# Patient Record
Sex: Female | Born: 1980 | Race: White | Hispanic: No | Marital: Married | Smoking: Former smoker
Health system: Southern US, Community
[De-identification: ages and names within clinical notes are randomized; demographics above are authoritative.]

## PROBLEM LIST (undated history)

## (undated) DIAGNOSIS — S83289A Other tear of lateral meniscus, current injury, unspecified knee, initial encounter: Secondary | ICD-10-CM

## (undated) DIAGNOSIS — M545 Low back pain, unspecified: Secondary | ICD-10-CM

## (undated) DIAGNOSIS — Z87442 Personal history of urinary calculi: Secondary | ICD-10-CM

## (undated) DIAGNOSIS — G8929 Other chronic pain: Secondary | ICD-10-CM

## (undated) DIAGNOSIS — G43909 Migraine, unspecified, not intractable, without status migrainosus: Secondary | ICD-10-CM

## (undated) DIAGNOSIS — S83271A Complex tear of lateral meniscus, current injury, right knee, initial encounter: Secondary | ICD-10-CM

## (undated) HISTORY — PX: KNEE ARTHROSCOPY: SHX127

## (undated) HISTORY — PX: KNEE ARTHROSCOPY: SUR90

## (undated) HISTORY — PX: TYMPANOSTOMY TUBE PLACEMENT: SHX32

---
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
## 2003-04-29 ENCOUNTER — Other Ambulatory Visit: Admission: RE | Admit: 2003-04-29 | Discharge: 2003-04-29 | Payer: Self-pay | Admitting: Gynecology

## 2004-11-07 ENCOUNTER — Encounter: Admission: RE | Admit: 2004-11-07 | Discharge: 2004-11-07 | Payer: Self-pay | Admitting: Orthopedic Surgery

## 2005-01-24 HISTORY — PX: LAPAROSCOPIC GASTRIC BANDING: SHX1100

## 2008-01-25 HISTORY — PX: EXTRACORPOREAL SHOCK WAVE LITHOTRIPSY: SHX1557

## 2008-03-05 ENCOUNTER — Encounter: Admission: RE | Admit: 2008-03-05 | Discharge: 2008-03-05 | Payer: Self-pay | Admitting: Family Medicine

## 2008-04-11 ENCOUNTER — Encounter: Admission: RE | Admit: 2008-04-11 | Discharge: 2008-04-11 | Payer: Self-pay | Admitting: Family Medicine

## 2008-05-22 ENCOUNTER — Encounter: Admission: RE | Admit: 2008-05-22 | Discharge: 2008-05-22 | Payer: Self-pay | Admitting: Family Medicine

## 2008-05-26 ENCOUNTER — Ambulatory Visit (HOSPITAL_COMMUNITY): Admission: RE | Admit: 2008-05-26 | Discharge: 2008-05-26 | Payer: Self-pay | Admitting: Urology

## 2008-11-07 ENCOUNTER — Ambulatory Visit: Payer: Self-pay | Admitting: Obstetrics and Gynecology

## 2008-11-07 ENCOUNTER — Inpatient Hospital Stay (HOSPITAL_COMMUNITY): Admission: AD | Admit: 2008-11-07 | Discharge: 2008-11-07 | Payer: Self-pay | Admitting: Obstetrics and Gynecology

## 2008-11-07 ENCOUNTER — Ambulatory Visit (HOSPITAL_COMMUNITY): Admission: RE | Admit: 2008-11-07 | Discharge: 2008-11-07 | Payer: Self-pay | Admitting: Obstetrics and Gynecology

## 2008-11-07 HISTORY — PX: LYSIS OF ADHESION: SHX5961

## 2008-11-07 HISTORY — PX: EXPLORATORY LAPAROTOMY: SUR591

## 2009-02-11 ENCOUNTER — Ambulatory Visit (HOSPITAL_COMMUNITY): Admission: RE | Admit: 2009-02-11 | Discharge: 2009-02-11 | Payer: Self-pay | Admitting: Obstetrics and Gynecology

## 2009-03-02 ENCOUNTER — Ambulatory Visit (HOSPITAL_COMMUNITY): Admission: RE | Admit: 2009-03-02 | Discharge: 2009-03-04 | Payer: Self-pay | Admitting: General Surgery

## 2009-03-02 HISTORY — PX: VENTRAL HERNIA REPAIR: SHX424

## 2010-04-14 LAB — PREGNANCY, URINE: Preg Test, Ur: NEGATIVE

## 2010-04-29 LAB — CBC
HCT: 37 % (ref 36.0–46.0)
Hemoglobin: 12.3 g/dL (ref 12.0–15.0)
MCHC: 33.1 g/dL (ref 30.0–36.0)
MCV: 85.7 fL (ref 78.0–100.0)
Platelets: 480 10*3/uL — ABNORMAL HIGH (ref 150–400)
RBC: 4.32 MIL/uL (ref 3.87–5.11)
RDW: 15 % (ref 11.5–15.5)
WBC: 10.4 10*3/uL (ref 4.0–10.5)

## 2010-04-29 LAB — PREGNANCY, URINE: Preg Test, Ur: NEGATIVE

## 2010-05-04 LAB — PREGNANCY, URINE: Preg Test, Ur: NEGATIVE

## 2013-06-21 ENCOUNTER — Other Ambulatory Visit: Payer: Self-pay | Admitting: Obstetrics and Gynecology

## 2013-06-21 DIAGNOSIS — N9489 Other specified conditions associated with female genital organs and menstrual cycle: Secondary | ICD-10-CM

## 2013-07-01 ENCOUNTER — Ambulatory Visit
Admission: RE | Admit: 2013-07-01 | Discharge: 2013-07-01 | Disposition: A | Discharge status: Home / Self Care | Payer: 59 | Source: Ambulatory Visit | Attending: Obstetrics and Gynecology | Admitting: Obstetrics and Gynecology

## 2013-07-01 DIAGNOSIS — N9489 Other specified conditions associated with female genital organs and menstrual cycle: Secondary | ICD-10-CM

## 2013-07-01 MED ORDER — GADOBENATE DIMEGLUMINE 529 MG/ML IV SOLN
20.0000 mL | Freq: Once | INTRAVENOUS | Status: AC | PRN
  Administered 2013-07-01: 20 mL via INTRAVENOUS

## 2013-07-17 ENCOUNTER — Encounter (HOSPITAL_BASED_OUTPATIENT_CLINIC_OR_DEPARTMENT_OTHER): Payer: Self-pay | Admitting: *Deleted

## 2013-07-17 NOTE — H&P (Signed)
Valerie Oconnor is an 33 y.o. G 0 with a 4 cm right adnexal mass. MRI confirms solid mass - possible fibroid - possible neoplasm. Right hydrosalpinx is noted. Patient complains of RLQ pain and dyspareunia.  Pertinent Gynecological History: Menses: flow is light Bleeding: none Contraception: none DES exposure: denies Blood transfusions: none Sexually transmitted diseases: no past history Previous GYN Procedures: none  Last mammogram: normal Date: 2014 Last pap: normal Date: 2015 OB History: G0, P0  Menstrual History: Menarche age: unknown  No LMP recorded.    No past medical history on file.  No past surgical history on file.  No family history on file.  Social History:  has no tobacco, alcohol, and drug history on file.  Allergies: Allergies not on file  No prescriptions prior to admission    Review of Systems  All other systems reviewed and are negative.   There were no vitals taken for this visit. Physical Exam  Nursing note and vitals reviewed. Constitutional: She appears well-developed.  HENT:  Head: Normocephalic.  Eyes: Pupils are equal, round, and reactive to light.  Respiratory: Effort normal.  Genitourinary:  Palpable solid  Mass right adnexal area  And tender to patient    No results found for this or any previous visit (from the past 24 hour(s)).  No results found.  Assessment/Plan: RIght adnexal mass Laparotomy, removal of right adnexal mass, possible frozen section with washings and possible right salpingoophorectomy Risks reviewed Patient marked Consent signed GREWAL,MICHELLE L 07/17/2013, 10:17 AM

## 2013-07-17 NOTE — Progress Notes (Signed)
NPO AFTER MN. ARRIVE AT 0600.  PT GETTING LAB WORK DONE THIS WEEK. REVIEWED [facility] GUIDELINES FOR POSS. OWER, WILL BRING MEDS.

## 2013-07-19 LAB — BASIC METABOLIC PANEL
BUN: 8 mg/dL (ref 6–23)
CALCIUM: 9 mg/dL (ref 8.4–10.5)
CO2: 27 mEq/L (ref 19–32)
Chloride: 99 mEq/L (ref 96–112)
Creatinine, Ser: 0.69 mg/dL (ref 0.50–1.10)
GFR calc Af Amer: >90 mL/min (ref >90)
GFR calc non Af Amer: >90 mL/min (ref >90)
GLUCOSE: 125 mg/dL — AB (ref 70–99)
POTASSIUM: 4 meq/L (ref 3.7–5.3)
SODIUM: 139 meq/L (ref 137–147)

## 2013-07-19 LAB — CBC
HCT: 35.7 % — ABNORMAL LOW (ref 36.0–46.0)
Hemoglobin: 11.8 g/dL — ABNORMAL LOW (ref 12.0–15.0)
MCH: 27.9 pg (ref 26.0–34.0)
MCHC: 33.1 g/dL (ref 30.0–36.0)
MCV: 84.4 fL (ref 78.0–100.0)
PLATELETS: 368 10*3/uL (ref 150–400)
RBC: 4.23 MIL/uL (ref 3.87–5.11)
RDW: 13.2 % (ref 11.5–15.5)
WBC: 10.5 10*3/uL (ref 4.0–10.5)

## 2013-07-22 ENCOUNTER — Observation Stay (HOSPITAL_BASED_OUTPATIENT_CLINIC_OR_DEPARTMENT_OTHER)
Admission: RE | Admit: 2013-07-22 | Discharge: 2013-07-23 | Disposition: A | Discharge status: Home / Self Care | Payer: 59 | Source: Ambulatory Visit | Attending: Physician Assistant | Admitting: Physician Assistant

## 2013-07-22 ENCOUNTER — Encounter (HOSPITAL_BASED_OUTPATIENT_CLINIC_OR_DEPARTMENT_OTHER): Payer: 59 | Admitting: Anesthesiology

## 2013-07-22 ENCOUNTER — Encounter (HOSPITAL_BASED_OUTPATIENT_CLINIC_OR_DEPARTMENT_OTHER): Payer: Self-pay | Admitting: *Deleted

## 2013-07-22 ENCOUNTER — Encounter (HOSPITAL_COMMUNITY)
Admission: RE | Disposition: A | Discharge status: Home / Self Care | Payer: Self-pay | Source: Ambulatory Visit | Attending: Obstetrics and Gynecology

## 2013-07-22 ENCOUNTER — Ambulatory Visit (HOSPITAL_BASED_OUTPATIENT_CLINIC_OR_DEPARTMENT_OTHER): Payer: 59 | Admitting: Anesthesiology

## 2013-07-22 DIAGNOSIS — F17200 Nicotine dependence, unspecified, uncomplicated: Secondary | ICD-10-CM | POA: Insufficient documentation

## 2013-07-22 DIAGNOSIS — D219 Benign neoplasm of connective and other soft tissue, unspecified: Secondary | ICD-10-CM | POA: Diagnosis present

## 2013-07-22 DIAGNOSIS — Z9884 Bariatric surgery status: Secondary | ICD-10-CM | POA: Insufficient documentation

## 2013-07-22 DIAGNOSIS — E669 Obesity, unspecified: Secondary | ICD-10-CM | POA: Insufficient documentation

## 2013-07-22 DIAGNOSIS — D259 Leiomyoma of uterus, unspecified: Secondary | ICD-10-CM

## 2013-07-22 DIAGNOSIS — D279 Benign neoplasm of unspecified ovary: Principal | ICD-10-CM | POA: Insufficient documentation

## 2013-07-22 HISTORY — DX: Personal history of urinary calculi: Z87.442

## 2013-07-22 HISTORY — PX: LAPAROTOMY: SHX154

## 2013-07-22 LAB — POCT I-STAT, CHEM 8
BUN: 10 mg/dL (ref 6–23)
Calcium, Ion: 1.17 mmol/L (ref 1.12–1.23)
Chloride: 104 mEq/L (ref 96–112)
Creatinine, Ser: 0.6 mg/dL (ref 0.50–1.10)
Glucose, Bld: 111 mg/dL — ABNORMAL HIGH (ref 70–99)
HEMATOCRIT: 37 % (ref 36.0–46.0)
Hemoglobin: 12.6 g/dL (ref 12.0–15.0)
POTASSIUM: 3.9 meq/L (ref 3.7–5.3)
Sodium: 139 mEq/L (ref 137–147)
TCO2: 22 mmol/L (ref 0–100)

## 2013-07-22 LAB — POCT PREGNANCY, URINE: Preg Test, Ur: NEGATIVE

## 2013-07-22 SURGERY — LAPAROTOMY
Anesthesia: General | Laterality: Right

## 2013-07-22 MED ORDER — DEXTROSE IN LACTATED RINGERS 5 % IV SOLN
INTRAVENOUS | Status: DC
  Administered 2013-07-22 – 2013-07-23 (×2): via INTRAVENOUS
  Filled 2013-07-22: qty 1000

## 2013-07-22 MED ORDER — DEXTROSE 5 % IV SOLN
INTRAVENOUS | Status: AC
  Administered 2013-07-22: 420 mL via INTRAVENOUS

## 2013-07-22 MED ORDER — SODIUM CHLORIDE 0.9 % IR SOLN
Status: DC | PRN
  Administered 2013-07-22: 1000 mL

## 2013-07-22 MED ORDER — MENTHOL 3 MG MT LOZG
1.0000 | LOZENGE | OROMUCOSAL | Status: DC | PRN
  Filled 2013-07-22: qty 9

## 2013-07-22 MED ORDER — IBUPROFEN 600 MG PO TABS
600.0000 mg | ORAL_TABLET | Freq: Four times a day (QID) | ORAL | Status: DC | PRN
  Administered 2013-07-22 – 2013-07-23 (×2): 600 mg via ORAL
  Filled 2013-07-22 (×3): qty 1

## 2013-07-22 MED ORDER — FENTANYL CITRATE 0.05 MG/ML IJ SOLN
INTRAMUSCULAR | Status: AC
  Filled 2013-07-22: qty 6

## 2013-07-22 MED ORDER — STERILE WATER FOR IRRIGATION IR SOLN
Status: DC | PRN
  Administered 2013-07-22: 500 mL

## 2013-07-22 MED ORDER — DEXAMETHASONE SODIUM PHOSPHATE 4 MG/ML IJ SOLN
INTRAMUSCULAR | Status: DC | PRN
  Administered 2013-07-22: 10 mg via INTRAVENOUS

## 2013-07-22 MED ORDER — PROMETHAZINE HCL 25 MG/ML IJ SOLN
INTRAMUSCULAR | Status: AC
  Filled 2013-07-22: qty 1

## 2013-07-22 MED ORDER — ONDANSETRON HCL 4 MG/2ML IJ SOLN
INTRAMUSCULAR | Status: DC | PRN
  Administered 2013-07-22: 4 mg via INTRAVENOUS

## 2013-07-22 MED ORDER — MIDAZOLAM HCL 2 MG/2ML IJ SOLN
INTRAMUSCULAR | Status: AC
  Filled 2013-07-22: qty 2

## 2013-07-22 MED ORDER — KETOROLAC TROMETHAMINE 30 MG/ML IJ SOLN
INTRAMUSCULAR | Status: DC | PRN
  Administered 2013-07-22: 30 mg via INTRAVENOUS

## 2013-07-22 MED ORDER — MIDAZOLAM HCL 5 MG/5ML IJ SOLN
INTRAMUSCULAR | Status: DC | PRN
  Administered 2013-07-22: 2 mg via INTRAVENOUS

## 2013-07-22 MED ORDER — ROCURONIUM BROMIDE 100 MG/10ML IV SOLN
INTRAVENOUS | Status: DC | PRN
  Administered 2013-07-22: 40 mg via INTRAVENOUS

## 2013-07-22 MED ORDER — LACTATED RINGERS IV SOLN
INTRAVENOUS | Status: DC
  Administered 2013-07-22: 07:00:00 via INTRAVENOUS
  Filled 2013-07-22: qty 1000

## 2013-07-22 MED ORDER — FENTANYL CITRATE 0.05 MG/ML IJ SOLN
INTRAMUSCULAR | Status: DC | PRN
Start: 2013-07-22 — End: 2013-07-22
  Administered 2013-07-22: 50 ug via INTRAVENOUS
  Administered 2013-07-22 (×2): 100 ug via INTRAVENOUS
  Administered 2013-07-22: 50 ug via INTRAVENOUS

## 2013-07-22 MED ORDER — PROMETHAZINE HCL 25 MG/ML IJ SOLN
6.2500 mg | INTRAMUSCULAR | Status: DC | PRN
  Administered 2013-07-22: 6.25 mg via INTRAVENOUS
  Filled 2013-07-22: qty 1

## 2013-07-22 MED ORDER — HYDROMORPHONE HCL PF 1 MG/ML IJ SOLN
INTRAMUSCULAR | Status: AC
  Filled 2013-07-22: qty 1

## 2013-07-22 MED ORDER — TRAMADOL HCL 50 MG PO TABS
50.0000 mg | ORAL_TABLET | Freq: Four times a day (QID) | ORAL | Status: DC | PRN
  Administered 2013-07-22 – 2013-07-23 (×3): 50 mg via ORAL
  Filled 2013-07-22 (×4): qty 1

## 2013-07-22 MED ORDER — GENTAMICIN SULFATE 40 MG/ML IJ SOLN
420.0000 mg | INTRAVENOUS | Status: DC
  Filled 2013-07-22: qty 10.5

## 2013-07-22 MED ORDER — KETOROLAC TROMETHAMINE 30 MG/ML IJ SOLN
30.0000 mg | Freq: Once | INTRAMUSCULAR | Status: DC
  Administered 2013-07-22: 30 mg via INTRAVENOUS
  Filled 2013-07-22: qty 1

## 2013-07-22 MED ORDER — GLYCOPYRROLATE 0.2 MG/ML IJ SOLN
INTRAMUSCULAR | Status: DC | PRN
  Administered 2013-07-22: 0.4 mg via INTRAVENOUS

## 2013-07-22 MED ORDER — LACTATED RINGERS IV SOLN
INTRAVENOUS | Status: DC
  Filled 2013-07-22: qty 1000

## 2013-07-22 MED ORDER — NEOSTIGMINE METHYLSULFATE 10 MG/10ML IV SOLN
INTRAVENOUS | Status: DC | PRN
  Administered 2013-07-22: 3 mg via INTRAVENOUS

## 2013-07-22 MED ORDER — LACTATED RINGERS IV SOLN
INTRAVENOUS | Status: DC
  Administered 2013-07-22: 06:00:00 via INTRAVENOUS
  Filled 2013-07-22: qty 1000

## 2013-07-22 MED ORDER — ZOLPIDEM TARTRATE 5 MG PO TABS
5.0000 mg | ORAL_TABLET | Freq: Every evening | ORAL | Status: DC | PRN
  Administered 2013-07-23: 5 mg via ORAL
  Filled 2013-07-22: qty 1

## 2013-07-22 MED ORDER — HYDROMORPHONE HCL PF 1 MG/ML IJ SOLN
0.2500 mg | INTRAMUSCULAR | Status: DC | PRN
  Administered 2013-07-22 (×3): 0.25 mg via INTRAVENOUS
  Filled 2013-07-22: qty 1

## 2013-07-22 MED ORDER — HYDROMORPHONE HCL 2 MG PO TABS
2.0000 mg | ORAL_TABLET | ORAL | Status: DC | PRN
  Administered 2013-07-22 – 2013-07-23 (×5): 2 mg via ORAL
  Filled 2013-07-22 (×6): qty 1

## 2013-07-22 MED ORDER — LIDOCAINE HCL (CARDIAC) 20 MG/ML IV SOLN
INTRAVENOUS | Status: DC | PRN
  Administered 2013-07-22: 100 mg via INTRAVENOUS

## 2013-07-22 MED ORDER — DULOXETINE HCL 60 MG PO CPEP
60.0000 mg | ORAL_CAPSULE | Freq: Every evening | ORAL | Status: DC
  Administered 2013-07-22: 60 mg via ORAL
  Filled 2013-07-22 (×3): qty 1

## 2013-07-22 MED ORDER — PROPOFOL 10 MG/ML IV BOLUS
INTRAVENOUS | Status: DC | PRN
  Administered 2013-07-22: 100 mg via INTRAVENOUS
  Administered 2013-07-22: 200 mg via INTRAVENOUS

## 2013-07-22 SURGICAL SUPPLY — 47 items
BAG URINE DRAINAGE (UROLOGICAL SUPPLIES) ×2 IMPLANT
BLADE HEX COATED 2.75 (ELECTRODE) ×2 IMPLANT
BLADE SURG 10 STRL SS (BLADE) ×2 IMPLANT
CANISTER SUCT 3000ML (MISCELLANEOUS) ×2 IMPLANT
CATH FOLEY 2WAY SLVR  5CC 14FR (CATHETERS) ×1
CATH FOLEY 2WAY SLVR 5CC 14FR (CATHETERS) ×1 IMPLANT
CLEANER CAUTERY TIP 5X5 PAD (MISCELLANEOUS) ×1 IMPLANT
COVER MAYO STAND STRL (DRAPES) ×2 IMPLANT
COVER TABLE BACK 60X90 (DRAPES) ×2 IMPLANT
DERMABOND ADVANCED (GAUZE/BANDAGES/DRESSINGS) ×1
DERMABOND ADVANCED .7 DNX12 (GAUZE/BANDAGES/DRESSINGS) ×1 IMPLANT
DRAPE WARM FLUID 44X44 (DRAPE) ×2 IMPLANT
DURAPREP 26ML APPLICATOR (WOUND CARE) ×2 IMPLANT
ELECT BLADE 6.5 .24CM SHAFT (ELECTRODE) ×2 IMPLANT
ELECT REM PT RETURN 9FT ADLT (ELECTROSURGICAL) ×2
ELECTRODE REM PT RTRN 9FT ADLT (ELECTROSURGICAL) ×1 IMPLANT
GAUZE SPONGE 4X4 16PLY XRAY LF (GAUZE/BANDAGES/DRESSINGS) ×2 IMPLANT
GLOVE BIO SURGEON STRL SZ 6.5 (GLOVE) ×2 IMPLANT
GLOVE BIO SURGEON STRL SZ7.5 (GLOVE) ×2 IMPLANT
GLOVE BIOGEL PI IND STRL 7.0 (GLOVE) ×3 IMPLANT
GLOVE BIOGEL PI INDICATOR 7.0 (GLOVE) ×3
GOWN STRL REUS W/ TWL LRG LVL3 (GOWN DISPOSABLE) ×1 IMPLANT
GOWN STRL REUS W/ TWL XL LVL3 (GOWN DISPOSABLE) ×1 IMPLANT
GOWN STRL REUS W/TWL LRG LVL3 (GOWN DISPOSABLE) ×3 IMPLANT
GOWN STRL REUS W/TWL XL LVL3 (GOWN DISPOSABLE) ×1
HOLDER FOLEY CATH W/STRAP (MISCELLANEOUS) ×2 IMPLANT
NEEDLE HYPO 22GX1.5 SAFETY (NEEDLE) ×2 IMPLANT
NS IRRIG 500ML POUR BTL (IV SOLUTION) ×4 IMPLANT
PACK ABDOMINAL GYN (CUSTOM PROCEDURE TRAY) ×2 IMPLANT
PACK BASIN DAY SURGERY FS (CUSTOM PROCEDURE TRAY) ×2 IMPLANT
PAD CLEANER CAUTERY TIP 5X5 (MISCELLANEOUS) ×1
PAD OB MATERNITY 4.3X12.25 (PERSONAL CARE ITEMS) ×2 IMPLANT
PENCIL BUTTON HOLSTER BLD 10FT (ELECTRODE) ×2 IMPLANT
SPONGE LAP 18X18 X RAY DECT (DISPOSABLE) ×2 IMPLANT
SUT PLAIN 2 0 XLH (SUTURE) ×2 IMPLANT
SUT VIC AB 0 CT1 18XCR BRD8 (SUTURE) ×1 IMPLANT
SUT VIC AB 0 CT1 36 (SUTURE) ×4 IMPLANT
SUT VIC AB 0 CT1 8-18 (SUTURE) ×1
SUT VIC AB 4-0 KS 27 (SUTURE) ×2 IMPLANT
SUT VICRYL 0 TIES 12 18 (SUTURE) ×2 IMPLANT
SYR BULB IRRIGATION 50ML (SYRINGE) ×2 IMPLANT
SYR CONTROL 10ML LL (SYRINGE) ×2 IMPLANT
TOWEL OR 17X24 6PK STRL BLUE (TOWEL DISPOSABLE) ×4 IMPLANT
TRAY DSU PREP LF (CUSTOM PROCEDURE TRAY) ×2 IMPLANT
TUBE CONNECTING 12X1/4 (SUCTIONS) ×2 IMPLANT
WATER STERILE IRR 500ML POUR (IV SOLUTION) ×2 IMPLANT
YANKAUER SUCT BULB TIP NO VENT (SUCTIONS) ×2 IMPLANT

## 2013-07-22 NOTE — Transfer of Care (Signed)
Immediate Anesthesia Transfer of Care Note  Patient: Valerie Oconnor  Procedure(s) Performed: Procedure(s): LAPAROTOMY, REMOVAL OF RIGHT OVARIAN MASS FROZEN SECTION, peritoneal washings (Right)  Patient Location: PACU  Anesthesia Type:General  Level of Consciousness: awake, alert , oriented and patient cooperative  Airway & Oxygen Therapy: Patient Spontanous Breathing and Patient connected to nasal cannula oxygen  Post-op Assessment: Report given to PACU RN and Post -op Vital signs reviewed and stable  Post vital signs: Reviewed and stable  Complications: No apparent anesthesia complications

## 2013-07-22 NOTE — Anesthesia Procedure Notes (Signed)
Procedure Name: Intubation Date/Time: 07/22/2013 7:32 AM Performed by: Wanita Chamberlain Pre-anesthesia Checklist: Patient identified, Timeout performed, Emergency Drugs available, Suction available and Patient being monitored Oxygen Delivery Method: Circle system utilized Preoxygenation: Pre-oxygenation with 100% oxygen Intubation Type: IV induction Ventilation: Mask ventilation without difficulty Laryngoscope Size: Mac and 3 Grade View: Grade I Tube type: Oral Tube size: 7.0 mm Number of attempts: 1 Airway Equipment and Method: Stylet Placement Confirmation: ETT inserted through vocal cords under direct vision,  breath sounds checked- equal and bilateral and positive ETCO2 Secured at: 21 cm Tube secured with: Tape Dental Injury: Teeth and Oropharynx as per pre-operative assessment

## 2013-07-22 NOTE — Op Note (Signed)
NAMEGEORGINA, Valerie Oconnor             ACCOUNT NO.:  [id number]  MEDICAL RECORD NO.:  [id number]  LOCATION:  [place]                         FACILITY:  [facility]  PHYSICIAN:  Covington Grewal, M.D.DATE OF BIRTH:  Nov 20, 1980  DATE OF PROCEDURE:  07/22/2013 DATE OF DISCHARGE:                              OPERATIVE REPORT   PREOPERATIVE DIAGNOSIS:  Solid right adnexal mass.  POSTOPERATIVE DIAGNOSIS:  Right ovarian fibroid.  PROCEDURE:  Mini-laparotomy, pelvic washings, frozen section with removal of solid right adnexal mass.  SURGEON:  Michelle L. Helane Rima, MD  ASSISTANT:  Evette Cristal, MD  ANESTHESIA:  General.  ESTIMATED BLOOD LOSS:  Minimal.  COMPLICATIONS:  None.  DRAINS:  Foley.  PATHOLOGY:  Right ovarian fibroid, sent to pathology.  DESCRIPTION OF PROCEDURE:  The patient was taken to the operating room. After she had been consented about the risk associated with the procedure, she was intubated.  She was then prepped and draped.  A Foley catheter was inserted.  Time-out was performed according to guidelines. A small mini-laparotomy Pfannenstiel incision was made and carried down to the fascia.  Fascia scored in the midline and extended laterally. Rectus muscles were separated in the midline.  The peritoneum was entered bluntly.  Peritoneal incision was then stretched.  Pelvic washings were performed.  Exam revealed a normal left adnexa.  She did have an enlarged right ovarian solid mass.  There were some little blebs on the surface.  For that reason, we decided to remove the mass and sent it for frozen section.  I placed a Haney clamp just beneath the base.  I was able to separate the mass easily from the ovary and the pedicle was secured using a suture ligature and a free tie using 0 Vicryl suture. Hemostasis was very good.  The fallopian tube looked fine.  We waited for frozen section and frozen section came back benign ovarian neoplasm favoring right ovarian fibroid versus  __________.  At this point, we decided to proceed with closure.  We closed the peritoneum using 0 Vicryl running stitch.  The rectus muscles were reapproximated using 0 Vicryl.  The fascia was closed using 0 Vicryl starting at each corner and meeting in the midline.  After irrigation of subcutaneous layer, the subcu layer was closed with plain gut interrupted.  The skin was closed with 4-0 Vicryl on a Keith needle.  All sponge, lap, and instrument counts were correct x2.  The patient went to recovery room in stable condition.     Michelle L. Helane Rima, M.D.     [name]  D:  07/22/2013  T:  07/22/2013  Job:  [id number]

## 2013-07-22 NOTE — Anesthesia Postprocedure Evaluation (Signed)
Anesthesia Post Note  Patient: Valerie Oconnor  Procedure(s) Performed: Procedure(s) (LRB): LAPAROTOMY, REMOVAL OF RIGHT OVARIAN MASS FROZEN SECTION, peritoneal washings (Right)  Anesthesia type: General  Patient location: PACU  Post pain: Pain level controlled  Post assessment: Post-op Vital signs reviewed  Last Vitals:  Filed Vitals:   07/22/13 1145  BP: 120/80  Pulse: 78  Temp: 36.3 C  Resp: 18    Post vital signs: Reviewed  Level of consciousness: sedated  Complications: No apparent anesthesia complications

## 2013-07-22 NOTE — Anesthesia Preprocedure Evaluation (Addendum)
Anesthesia Evaluation  Patient identified by MRN, date of birth, ID band Patient awake    Reviewed: Allergy & Precautions, H&P , NPO status , Patient's Chart, lab work & pertinent test results  Airway Mallampati: II TM Distance: >3 FB Neck ROM: Full    Dental  (+) Teeth Intact, Dental Advisory Given   Pulmonary neg pulmonary ROS, Current Smoker,  breath sounds clear to auscultation        Cardiovascular Exercise Tolerance: Good negative cardio ROS  Rhythm:Regular Rate:Normal     Neuro/Psych negative neurological ROS  negative psych ROS   GI/Hepatic negative GI ROS, Neg liver ROS, Prior gastric banding   Endo/Other  negative endocrine ROSMorbid obesity  Renal/GU negative Renal ROS  negative genitourinary   Musculoskeletal negative musculoskeletal ROS (+)   Abdominal (+) + obese,   Peds  Hematology negative hematology ROS (+)   Anesthesia Other Findings Lap. Band in the past  Reproductive/Obstetrics negative OB ROS                       Anesthesia Physical Anesthesia Plan  ASA: II  Anesthesia Plan: General   Post-op Pain Management:    Induction: Intravenous  Airway Management Planned: Oral ETT  Additional Equipment:   Intra-op Plan:   Post-operative Plan: Extubation in OR  Informed Consent: I have reviewed the patients History and Physical, chart, labs and discussed the procedure including the risks, benefits and alternatives for the proposed anesthesia with the patient or authorized representative who has indicated his/her understanding and acceptance.   Dental advisory given  Plan Discussed with: CRNA  Anesthesia Plan Comments:         Anesthesia Quick Evaluation

## 2013-07-22 NOTE — Brief Op Note (Signed)
07/22/2013  8:54 AM  PATIENT:  Valerie Oconnor  33 y.o. female  PRE-OPERATIVE DIAGNOSIS:  right ovarian mass  POST-OPERATIVE DIAGNOSIS:  right ovarian mass  PROCEDURE:  Procedure(s): LAPAROTOMY, REMOVAL OF RIGHT OVARIAN MASS FROZEN SECTION, peritoneal washings (Right)  SURGEON:  Surgeon(s) and Role:    * Cyril Mourning, MD - Primary    * W Evette Cristal, MD  PHYSICIAN ASSISTANT:   ASSISTANTS: none   ANESTHESIA:   general  EBL:  Total I/O In: 1050 [I.V.:1050] Out: 140 [Urine:125; Blood:15]  BLOOD ADMINISTERED:none  DRAINS: Urinary Catheter (Foley)   LOCAL MEDICATIONS USED:  NONE  SPECIMEN:  Source of Specimen:  right ovary  DISPOSITION OF SPECIMEN:  PATHOLOGY  COUNTS:  YES  TOURNIQUET:  * No tourniquets in log *  DICTATION: .Other Dictation: Dictation Number [id number]  PLAN OF CARE: Admit for overnight observation  PATIENT DISPOSITION:  PACU - hemodynamically stable.   Delay start of Pharmacological VTE agent (>24hrs) due to surgical blood loss or risk of bleeding: not applicable

## 2013-07-23 ENCOUNTER — Encounter (HOSPITAL_BASED_OUTPATIENT_CLINIC_OR_DEPARTMENT_OTHER): Payer: Self-pay | Admitting: Obstetrics and Gynecology

## 2013-07-23 LAB — CBC
HEMATOCRIT: 31.7 % — AB (ref 36.0–46.0)
Hemoglobin: 10.4 g/dL — ABNORMAL LOW (ref 12.0–15.0)
MCH: 27.7 pg (ref 26.0–34.0)
MCHC: 32.8 g/dL (ref 30.0–36.0)
MCV: 84.5 fL (ref 78.0–100.0)
PLATELETS: 351 10*3/uL (ref 150–400)
RBC: 3.75 MIL/uL — ABNORMAL LOW (ref 3.87–5.11)
RDW: 13.6 % (ref 11.5–15.5)
WBC: 11.7 10*3/uL — AB (ref 4.0–10.5)

## 2013-07-23 MED ORDER — TRAMADOL HCL 50 MG PO TABS: 50.0000 mg | ORAL_TABLET | Freq: Four times a day (QID) | ORAL | Status: DC | PRN

## 2013-07-23 MED ORDER — IBUPROFEN 600 MG PO TABS: 600.0000 mg | ORAL_TABLET | Freq: Four times a day (QID) | ORAL | Status: DC | PRN

## 2013-07-23 MED ORDER — HYDROMORPHONE HCL 2 MG PO TABS
4.0000 mg | ORAL_TABLET | ORAL | Status: DC | PRN
  Administered 2013-07-23: 2 mg via ORAL
  Filled 2013-07-23 (×2): qty 2

## 2013-07-23 MED ORDER — BIOTENE DRY MOUTH MT LIQD
15.0000 mL | Freq: Two times a day (BID) | OROMUCOSAL | Status: DC
  Administered 2013-07-23: 15 mL via OROMUCOSAL

## 2013-07-23 MED ORDER — HYDROMORPHONE HCL 2 MG PO TABS
4.0000 mg | ORAL_TABLET | ORAL | Status: DC | PRN
  Administered 2013-07-23: 4 mg via ORAL

## 2013-07-23 MED ORDER — HYDROMORPHONE HCL 4 MG PO TABS: 4.0000 mg | ORAL_TABLET | ORAL | Status: DC | PRN

## 2013-07-23 NOTE — Discharge Summary (Signed)
Admission diagnosis: Solid adnexal mass  Discharge diagnosis: Right ovarian fibroid  Hospital Course: 33 year old female with right adnexal mass. Underwent laparotomy and removal of right ovarian fibroid. She did very well post op with good pain control on dilaudid 4 mg every 3 hours. She went home on POD #1 after ambulating, voiding and tolerating regular diet. She will follow up in 1 week She was given the standard discharge instructions.

## 2013-07-23 NOTE — Progress Notes (Signed)
1 Day Post-Op Procedure(s) (LRB): LAPAROTOMY, REMOVAL OF RIGHT OVARIAN MASS FROZEN SECTION, peritoneal washings (Right)  Subjective: Patient reports incisional pain, tolerating PO and no problems voiding.  Pain at a 6 on 2 mg Dilaudid.  Objective: I have reviewed patient's vital signs, intake and output, medications and labs.  General: alert, cooperative and appears stated age Vaginal Bleeding: none abdomen is soft and non tender  Assessment: s/p Procedure(s): LAPAROTOMY, REMOVAL OF RIGHT OVARIAN MASS FROZEN SECTION, peritoneal washings (Right): stable, progressing well and tolerating diet  Plan: Advance diet Encourage ambulation Discontinue IV fluids  LOS: 1 day    Donie Lemelin L 07/23/2013, 7:17 AM

## 2014-02-25 ENCOUNTER — Other Ambulatory Visit: Payer: Self-pay | Admitting: Obstetrics and Gynecology

## 2014-02-26 LAB — CYTOLOGY - PAP

## 2015-09-03 ENCOUNTER — Encounter: Payer: Self-pay | Admitting: Physical Therapy

## 2015-09-03 ENCOUNTER — Ambulatory Visit: Payer: 59 | Attending: Anesthesiology | Admitting: Physical Therapy

## 2015-09-03 DIAGNOSIS — M5440 Lumbago with sciatica, unspecified side: Secondary | ICD-10-CM | POA: Diagnosis present

## 2015-09-03 DIAGNOSIS — M6281 Muscle weakness (generalized): Secondary | ICD-10-CM | POA: Diagnosis not present

## 2015-09-03 NOTE — Therapy (Signed)
[facility] [address], [place], [place], [address] Phone: [phone]   Fax:  [phone]  Physical Therapy Evaluation  Patient Details  Name: Valerie Oconnor MRN: [id number] Date of Birth: 20-Apr-1980 Referring Provider: Dr. Shanon Ace  Encounter Date: 09/03/2015      PT End of Session - 09/03/15 1311    Visit Number 1   Date for PT Re-Evaluation 10/15/15   PT Start Time 1230   PT Stop Time 1310   PT Time Calculation (min) 40 min   Activity Tolerance Patient tolerated treatment well   Behavior During Therapy [facility] for tasks assessed/performed      Past Medical History:  Diagnosis Date  . Adnexal mass    RIGHT OVARY  . History of kidney stones   . Wears contact lenses     Past Surgical History:  Procedure Laterality Date  . ADENOIDECTOMY  1992  . EXPLORATORY LAPAROTOMY WITH LYSIS ADHESIONS AND REMOVAL OVARIAN MASS  2011  . EXTRACORPOREAL SHOCK WAVE LITHOTRIPSY  2010  . INGUINAL HERNIA REPAIR Bilateral 1985  . KNEE ARTHROSCOPY Bilateral LEFT X1  2001///   RIGHT  2002  &  2004  . LAPAROSCOPIC GASTRIC BANDING  2007  . LAPAROTOMY Right 07/22/2013   Procedure: LAPAROTOMY, REMOVAL OF RIGHT OVARIAN MASS FROZEN SECTION, peritoneal washings;  Surgeon: Cyril Mourning, MD;  Location: [place];  Service: Gynecology;  Laterality: Right;  . TONSILLECTOMY  1993  . TYMPANOSTOMY TUBE PLACEMENT Bilateral X6  sets  LAST ONE  1992  . VENTRAL HERNIA REPAIR  2012    There were no vitals filed for this visit.       Subjective Assessment - 09/03/15 1243    Subjective Patient reports constant back pain.  Chronic  back pain.  Progressively worse. 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    Limitations Sitting;Standing;Walking   How long can you sit comfortably? depends on chair and back rest   Patient Stated Goals get back into school for nursing, ride horses again   Currently in Pain?  Yes   Pain Score 5    Pain Location Back   Pain Orientation Lower   Pain Descriptors / Indicators Spasm;Aching;Constant;Shooting   Pain Type Chronic pain   Pain Radiating Towards to right leg and gets weakness with walking   Pain Onset Other (comment)   Pain Frequency Constant   Aggravating Factors  sleeping, sitting, standing   Pain Relieving Factors pain medicine takes the edge off, heat   Multiple Pain Sites Yes            [facility] PT Assessment - 09/03/15 0001      Assessment   Medical Diagnosis M54.5 lumbago  Dr. Shanon Ace   Referring Provider Dr. Shanon Ace   Onset Date/Surgical Date 03/06/15   Prior Therapy None for back     Precautions   Precautions None     Restrictions   Weight Bearing Restrictions No     Balance Screen   Has the patient fallen in the past 6 months Yes  flipped over baby gait when not looking where she was going   How many times? 1   Has the patient had a decrease in activity level because of a fear of falling?  No   Is the patient reluctant to leave their home because of a fear of falling?  No     Prior Function   Level of Independence Independent  Leisure not active     Cognition   Overall Cognitive Status Within Functional Limits for tasks assessed     Observation/Other Assessments   Focus on Therapeutic Outcomes (FOTO)  52% limitation  goal is 43% limitation     ROM / Strength   AROM / PROM / Strength AROM;Strength     AROM   Overall AROM Comments when patient moves her trunk majority of movement is at L3 level   Lumbar Flexion full   Lumbar Extension decreased by 50%   Lumbar - Right Side Bend decreased by 50%   Lumbar - Left Side Bend decreased by 50%     Strength   Right Hip Flexion 4/5   Right Hip External Rotation  4/5   Right Hip ABduction 4/5   Left Hip External Rotation 4/5     Palpation   Spinal mobility T5-L3 decreased post. ant movement   Palpation comment tenderness located on left  lower thoracic/lumbar paraspinals, left quadratus,                            PT Education - 09/03/15 1311    Education provided Yes   Education Details stretches for back   Person(s) Educated Patient   Methods Explanation;Demonstration;Handout   Comprehension Verbalized understanding;Returned demonstration          PT Short Term Goals - 09/03/15 1319      PT SHORT TERM GOAL #1   Title indepedent with initial HEP   Time 3   Period Weeks   Status New     PT SHORT TERM GOAL #2   Title ability to clean her house for 1 hour with pain decreased >/= 25%    Time 3   Period Weeks   Status New     PT SHORT TERM GOAL #3   Title lightly ride her horse for 20 min with minimal pain   Time 3   Period Weeks   Status New           PT Long Term Goals - 09/03/15 1306      PT LONG TERM GOAL #1   Title independent with HEP   Time 6   Period Weeks   Status New     PT LONG TERM GOAL #2   Title be able to clean her house with correct body mechanics for 1 hour and take 2 rests   Time 6   Period Weeks   Status New     PT LONG TERM GOAL #3   Title lightly ride her horse for 1 hour due to improved back movement   Time 6   Period Weeks   Status New     PT LONG TERM GOAL #4   Title groom horse with correct body mechanics for 1 hour due to increased back strength   Time 6   Period Weeks   Status New     PT LONG TERM GOAL #5   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  Patient reports her constant lumbar pain is 5/10 with sleeping,  standing, lifting, and sitting increases pain. HIp strength averages 4/5. Lumbar ROM limted by 50% except with flexion and majority of movement is at L3.  Decreased mobility  of T5-L3.  Palpable tenderness located in left quadratus and left low thoracic and lumbar paraspinals.  Abdominal strength is 2/5.  Patient has stopped riding her horse due to pain and stopped being a CNA. Patient is of low complexity.  Patient will benefit from skilled PT to increase lumbar ROM and strength so she is able to ride horses and return to being a CNA.     Rehab Potential Good   PT Frequency 2x / week   PT Duration 6 weeks   PT Treatment/Interventions Dry needling;Therapeutic exercise;Therapeutic activities;Cryotherapy;Electrical Stimulation;Ultrasound;Traction;Moist Heat;Patient/family education;Neuromuscular re-education;Manual techniques;Passive range of motion;Taping;Energy conservation  home tens unit   PT Next Visit Plan dry needling to left low thoracic/lumbar paraspinals and left quadratus, Lumbar stabilization exercises, soft tissue work, lumbar traction, body mechanics    PT Home Exercise Plan back stabilization   Recommended Other Services none   Consulted and Agree with Plan of Care Patient      Patient will benefit from skilled therapeutic intervention in order to improve the following deficits and impairments:  Decreased range of motion, Increased fascial restricitons, Increased muscle spasms, Decreased endurance, Decreased activity tolerance, Pain, Impaired flexibility, Decreased strength, Decreased mobility  Visit Diagnosis: Muscle weakness (generalized) - Plan: PT plan of care cert/re-cert  Low back pain with sciatica, sciatica laterality unspecified, unspecified back pain laterality - Plan: PT plan of care cert/re-cert     Problem List Patient Active Problem List   Diagnosis Date Noted  . Fibroid 07/22/2013   Earlie Counts, PT 09/03/15 1:24 PM   [facility] [address], [place], [place], [address] Phone: [phone]   Fax:  [phone]  Name: Valerie Oconnor MRN: [id number] Date of  Birth: 01/19/1981

## 2015-09-03 NOTE — Patient Instructions (Addendum)
Knee-to-Chest Stretch: Unilateral    With hand behind right knee, pull knee in to chest until a comfortable stretch is felt in lower back and buttocks. Keep back relaxed. Hold _30___ seconds. Repeat __2__ times per set. Do _1___ sets per session. Do __1__ sessions per day.  http://orth.exer.us/126   Copyright  VHI. All rights reserved.   Lumbar Rotation Stretch    Lie on back with left knee drawn toward chest. Slowly bring bent leg across body until stretch is felt in lower back/hip area. Hold _30___ seconds. Can use hand to bring bent knee over.  Repeat _2__ times per set. Do __1__ sets per session. Do __1__ sessions per day.  http://orth.exer.us/198   Copyright  VHI. All rights reserved.  Mid-Back Stretch    Push chest toward floor, reaching forward as far as possible. Hold __30__ seconds. Repeat _2___ times per set. Do _1___ sets per session. Do __1__ sessions per day.  http://orth.exer.us/130   Copyright  VHI. All rights reserved.  [place] [address], [place], [place] [address] Phone # [phone] Fax [phone]

## 2015-09-07 ENCOUNTER — Ambulatory Visit: Payer: 59 | Admitting: Physical Therapy

## 2015-09-10 ENCOUNTER — Ambulatory Visit: Payer: 59 | Admitting: Physical Therapy

## 2015-09-10 DIAGNOSIS — M6281 Muscle weakness (generalized): Secondary | ICD-10-CM

## 2015-09-10 DIAGNOSIS — M5440 Lumbago with sciatica, unspecified side: Secondary | ICD-10-CM

## 2015-09-10 NOTE — Patient Instructions (Signed)
     Trigger Point Dry Needling  . What is Trigger Point Dry Needling (DN)? o DN is a physical therapy technique used to treat muscle pain and dysfunction. Specifically, DN helps deactivate muscle trigger points (muscle knots).  o A thin filiform needle is used to penetrate the skin and stimulate the underlying trigger point. The goal is for a local twitch response (LTR) to occur and for the trigger point to relax. No medication of any kind is injected during the procedure.   . What Does Trigger Point Dry Needling Feel Like?  o The procedure feels different for each individual patient. Some patients report that they do not actually feel the needle enter the skin and overall the process is not painful. Very mild bleeding may occur. However, many patients feel a deep cramping in the muscle in which the needle was inserted. This is the local twitch response.   . How Will I feel after the treatment? o Soreness is normal, and the onset of soreness may not occur for a few hours. Typically this soreness does not last longer than two days.  o Bruising is uncommon, however; ice can be used to decrease any possible bruising.  o In rare cases feeling tired or nauseous after the treatment is normal. In addition, your symptoms may get worse before they get better, this period will typically not last longer than 24 hours.   . What Can I do After My Treatment? o Increase your hydration by drinking more water for the next 24 hours. o You may place ice or heat on the areas treated that have become sore, however, do not use heat on inflamed or bruised areas. Heat often brings more relief post needling. o You can continue your regular activities, but vigorous activity is not recommended initially after the treatment for 24 hours. o DN is best combined with other physical therapy such as strengthening, stretching, and other therapies.    Shatiqua Heroux PT [facility] [address], [address] [place], [place] [address] Phone # [phone] Fax [phone] 

## 2015-09-10 NOTE — Therapy (Signed)
[facility] [address] [place], [place], [address] Phone: [phone]   Fax:  [phone]  Physical Therapy Treatment  Patient Details  Name: Valerie Oconnor MRN: [id number] Date of Birth: 1980/07/26 Referring Provider: Dr. Shanon Ace  Encounter Date: 09/10/2015      PT End of Session - 09/10/15 1443    Visit Number 2   Date for PT Re-Evaluation 10/15/15   PT Start Time 0800   PT Stop Time 0900   PT Time Calculation (min) 60 min   Activity Tolerance Patient tolerated treatment well      Past Medical History:  Diagnosis Date  . Adnexal mass    RIGHT OVARY  . History of kidney stones   . Wears contact lenses     Past Surgical History:  Procedure Laterality Date  . ADENOIDECTOMY  1992  . EXPLORATORY LAPAROTOMY WITH LYSIS ADHESIONS AND REMOVAL OVARIAN MASS  2011  . EXTRACORPOREAL SHOCK WAVE LITHOTRIPSY  2010  . INGUINAL HERNIA REPAIR Bilateral 1985  . KNEE ARTHROSCOPY Bilateral LEFT X1  2001///   RIGHT  2002  &  2004  . LAPAROSCOPIC GASTRIC BANDING  2007  . LAPAROTOMY Right 07/22/2013   Procedure: LAPAROTOMY, REMOVAL OF RIGHT OVARIAN MASS FROZEN SECTION, peritoneal washings;  Surgeon: Cyril Mourning, MD;  Location: [place];  Service: Gynecology;  Laterality: Right;  . TONSILLECTOMY  1993  . TYMPANOSTOMY TUBE PLACEMENT Bilateral X6  sets  LAST ONE  1992  . VENTRAL HERNIA REPAIR  2012    There were no vitals filed for this visit.      Subjective Assessment - 09/10/15 0806    Subjective I've been doing my stretching.  The child pose has to be modified b/c of my knees.  They increased my Percoset yesterday which has helped a bit.  I got to sleep a little last night.  Getting an ESI on Monday.     Currently in Pain? Yes   Pain Score 4    Pain Location Back   Pain Orientation Lower   Pain Radiating Towards pain down right leg comes and goes                          [facility] Adult PT Treatment/Exercise - 09/10/15 0001      Lumbar Exercises: Supine   Ab Set 10 reps   Isometric Hip Flexion 10 reps     Moist Heat Therapy   Number Minutes Moist Heat 15 Minutes   Moist Heat Location Lumbar Spine     Electrical Stimulation   Electrical Stimulation Location lumbar   Electrical Stimulation Action IFC   Electrical Stimulation Parameters 7 ma   Electrical Stimulation Goals Pain     Manual Therapy   Soft tissue mobilization lumbar paraspinals and left quadruatus lumborum          Trigger Point Dry Needling - 09/10/15 1442    Consent Given? Yes   Education Handout Provided Yes   Muscles Treated Lower Body --  bilateral lumbar multifidi; left quadruatus lumborum              PT Education - 09/10/15 1443    Education provided Yes   Education Details ab brace grade 1/2;  dry needling after care   Person(s) Educated Patient   Methods Explanation;Demonstration;Handout   Comprehension Verbalized understanding;Returned demonstration          PT Short Term Goals -  09/10/15 1552      PT SHORT TERM GOAL #1   Title indepedent with initial HEP   Time 3   Period Weeks   Status On-going     PT SHORT TERM GOAL #2   Title ability to clean her house for 1 hour with pain decreased >/= 25%    Time 3   Period Weeks   Status On-going     PT SHORT TERM GOAL #3   Title lightly ride her horse for 20 min with minimal pain   Time 3   Period Weeks   Status On-going           PT Long Term Goals - 09/10/15 1553      PT LONG TERM GOAL #1   Title independent with HEP   Time 6   Period Weeks   Status On-going     PT LONG TERM GOAL #2   Title be able to clean her house with correct body mechanics for 1 hour and take 2 rests   Time 6   Period Weeks   Status On-going     PT LONG TERM GOAL #3   Title lightly ride her horse for 1 hour due to improved back movement   Period Weeks   Status On-going      PT LONG TERM GOAL #4   Title groom horse with correct body mechanics for 1 hour due to increased back strength   Time 6   Period Weeks   Status On-going     PT LONG TERM GOAL #5   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    Time 6   Period Weeks   Status On-going               Plan - 09/10/15 1443    Clinical Impression Statement The patient has difficulty activating transverse abdominals requiring verbal and tactile cues.  Marked tender point in left quadruatus lumborum muscle with improved muscle length following dry needling and manual techniques.  Good pain relief from e-stim/heat.  Therapist closely monitoring response with all treatment interventions.     PT Next Visit Plan assess response to DN #1;  add psoas and quadratus stretching;  progress core stabilization ex's;  continue with manual techniques;  modalities      Patient will benefit from skilled therapeutic intervention in order to improve the following deficits and impairments:     Visit Diagnosis: Muscle weakness (generalized)  Low back pain with sciatica, sciatica laterality unspecified, unspecified back pain laterality     Problem List Patient Active Problem List   Diagnosis Date Noted  . Fibroid 07/22/2013   Ruben Im, PT 09/10/15 3:54 PM Phone: [phone] Fax: [phone] [name] 09/10/2015, 3:54 PM   [facility] [address] [place], [place], [address] Phone: [phone]   Fax:  [phone]  Name: Valerie Oconnor MRN: [id number] Date of Birth: 01/08/1981

## 2015-09-14 ENCOUNTER — Ambulatory Visit: Payer: 59 | Admitting: Physical Therapy

## 2015-09-14 ENCOUNTER — Encounter: Payer: Self-pay | Admitting: Physical Therapy

## 2015-09-14 ENCOUNTER — Ambulatory Visit: Payer: 59

## 2015-09-14 DIAGNOSIS — M5440 Lumbago with sciatica, unspecified side: Secondary | ICD-10-CM

## 2015-09-14 DIAGNOSIS — M6281 Muscle weakness (generalized): Secondary | ICD-10-CM | POA: Diagnosis not present

## 2015-09-14 NOTE — Therapy (Addendum)
[facility] [address] [place], [place], [address] Phone: [phone]   Fax:  [phone]  Physical Therapy Treatment/Discharge Summary  Patient Details  Name: Valerie Oconnor MRN: [id number] Date of Birth: 16-Jul-1980 Referring Provider: Dr. Shanon Ace  Encounter Date: 09/14/2015      PT End of Session - 09/14/15 1310    Visit Number 3   Date for PT Re-Evaluation 10/15/15   PT Start Time 1237   PT Stop Time 1333   PT Time Calculation (min) 56 min   Activity Tolerance Patient tolerated treatment well   Behavior During Therapy [facility] for tasks assessed/performed      Past Medical History:  Diagnosis Date  . Adnexal mass    RIGHT OVARY  . History of kidney stones   . Wears contact lenses     Past Surgical History:  Procedure Laterality Date  . ADENOIDECTOMY  1992  . EXPLORATORY LAPAROTOMY WITH LYSIS ADHESIONS AND REMOVAL OVARIAN MASS  2011  . EXTRACORPOREAL SHOCK WAVE LITHOTRIPSY  2010  . INGUINAL HERNIA REPAIR Bilateral 1985  . KNEE ARTHROSCOPY Bilateral LEFT X1  2001///   RIGHT  2002  &  2004  . LAPAROSCOPIC GASTRIC BANDING  2007  . LAPAROTOMY Right 07/22/2013   Procedure: LAPAROTOMY, REMOVAL OF RIGHT OVARIAN MASS FROZEN SECTION, peritoneal washings;  Surgeon: Cyril Mourning, MD;  Location: [place];  Service: Gynecology;  Laterality: Right;  . TONSILLECTOMY  1993  . TYMPANOSTOMY TUBE PLACEMENT Bilateral X6  sets  LAST ONE  1992  . VENTRAL HERNIA REPAIR  2012    There were no vitals filed for this visit.      Subjective Assessment - 09/14/15 1243    Subjective Pt reports having some increased tenderness after last therapy session possibly from needles but can tell it has made some difference in over all pain. Pt reports feeling less "knotted" and having few spasms. Reports no radiatiing pain.    Limitations Sitting;Standing;Walking   How long can you sit comfortably?  depends on chair and back rest   Patient Stated Goals get back into school for nursing, ride horses again   Currently in Pain? Yes   Pain Score 3    Pain Location Back   Pain Orientation Lower   Pain Descriptors / Indicators Aching;Constant;Spasm   Pain Type Chronic pain   Pain Radiating Towards none   Pain Onset Other (comment)   Pain Frequency Constant   Aggravating Factors  sleeping, standing, standing   Pain Relieving Factors Pain meds, heat   Multiple Pain Sites No                         [facility] Adult PT Treatment/Exercise - 09/14/15 0001      Exercises   Exercises Lumbar;Knee/Hip     Lumbar Exercises: Supine   Ab Set 10 reps   Clam 10 reps;3 seconds   Heel Slides 20 reps   Bent Knee Raise 20 reps;1 second  Supine Marches 2x20     Knee/Hip Exercises: Stretches   Passive Hamstring Stretch Both;2 reps;10 seconds   Piriformis Stretch 2 reps;10 seconds     Electrical Stimulation   Electrical Stimulation Location Lumbar   Electrical Stimulation Action IFC   Electrical Stimulation Parameters 7 MA   Electrical Stimulation Goals Pain     Manual Therapy   Manual Therapy Other (comment)  cupping   Soft tissue mobilization Lumbar paraspinals  PT Short Term Goals - 09/14/15 1247      PT SHORT TERM GOAL #1   Title indepedent with initial HEP   Time 3   Period Weeks     PT SHORT TERM GOAL #2   Title ability to clean her house for 1 hour with pain decreased >/= 25%    Time 3   Period Weeks   Status On-going     PT SHORT TERM GOAL #3   Title lightly ride her horse for 20 min with minimal pain   Time 3   Period Weeks   Status On-going           PT Long Term Goals - 09/14/15 1247      PT LONG TERM GOAL #1   Title independent with HEP   Time 6   Period Weeks   Status On-going     PT LONG TERM GOAL #2   Title be able to clean her house with correct body mechanics for 1 hour and take 2 rests   Time 6   Period  Weeks   Status On-going     PT LONG TERM GOAL #3   Title lightly ride her horse for 1 hour due to improved back movement   Time 6   Period Weeks   Status On-going     PT LONG TERM GOAL #4   Title groom horse with correct body mechanics for 1 hour due to increased back strength   Time 6   Period Weeks   Status On-going     PT LONG TERM GOAL #5   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    Time 6   Period Weeks   Status On-going               Plan - 09/14/15 1311    Clinical Impression Statement Pt tolerated dry needeling well with some increase in tenderness at treatment spot. Pt reports she does feel a difference in pain after needling and wants to continue. Cupping therapy to low back along lumbar paraspinals, some reddness but no pain. Pt tolerated well. Pt educated in dipharamic breathing technique for care and diaphram strengthening.     Rehab Potential Good   PT Frequency 2x / week   PT Duration 6 weeks   PT Treatment/Interventions Dry needling;Therapeutic exercise;Therapeutic activities;Cryotherapy;Electrical Stimulation;Ultrasound;Traction;Moist Heat;Patient/family education;Neuromuscular re-education;Manual techniques;Passive range of motion;Taping;Energy conservation   PT Next Visit Plan Continue to strengthen core    PT Home Exercise Plan back stabilization   Consulted and Agree with Plan of Care Patient     PHYSICAL THERAPY DISCHARGE SUMMARY  Visits from Start of Care: 3  Current functional level related to goals / functional outcomes: The patient has cancelled and no-showed several times for appointments with last visit on 09/14/15.  Per facility policy, will discharge from PT at this time.     Remaining deficits: No progress toward goals secondary to inconsistent attendance and only 3 visits.     Education / Equipment: Initial HEP Plan:                                                    Patient goals were not met.  Patient is being discharged due to not returning since the last visit.  ?????  Patient will benefit from skilled therapeutic intervention in order to improve the following deficits and impairments:  Decreased range of motion, Increased fascial restricitons, Increased muscle spasms, Decreased endurance, Decreased activity tolerance, Pain, Impaired flexibility, Decreased strength, Decreased mobility  Visit Diagnosis: Muscle weakness (generalized)  Low back pain with sciatica, sciatica laterality unspecified, unspecified back pain laterality     Problem List Patient Active Problem List   Diagnosis Date Noted  . Fibroid 07/22/2013   Ruben Im, PT 10/08/15 2:35 PM Phone: [phone] Fax: [phone]  Mikle Bosworth PTA 09/14/2015, 1:31 PM  [facility] [address], [place], [place], [address] Phone: [phone]   Fax:  [phone]  Name: Valerie Oconnor MRN: [id number] Date of Birth: December 28, 1980

## 2015-09-18 ENCOUNTER — Ambulatory Visit: Payer: 59 | Admitting: Physical Therapy

## 2015-09-21 ENCOUNTER — Encounter: Payer: Self-pay | Admitting: Physical Therapy

## 2015-09-24 ENCOUNTER — Ambulatory Visit: Payer: 59 | Admitting: Physical Therapy

## 2015-09-29 ENCOUNTER — Ambulatory Visit: Payer: 59 | Attending: Anesthesiology | Admitting: Physical Therapy

## 2015-10-01 ENCOUNTER — Ambulatory Visit: Payer: 59 | Admitting: Physical Therapy

## 2015-10-05 ENCOUNTER — Ambulatory Visit: Payer: 59 | Admitting: Physical Therapy

## 2015-10-08 ENCOUNTER — Ambulatory Visit: Payer: 59 | Admitting: Physical Therapy

## 2015-10-14 ENCOUNTER — Encounter (HOSPITAL_COMMUNITY): Payer: Self-pay | Admitting: Emergency Medicine

## 2015-10-14 DIAGNOSIS — G9340 Encephalopathy, unspecified: Secondary | ICD-10-CM | POA: Insufficient documentation

## 2015-10-14 DIAGNOSIS — T43215A Adverse effect of selective serotonin and norepinephrine reuptake inhibitors, initial encounter: Secondary | ICD-10-CM | POA: Insufficient documentation

## 2015-10-14 DIAGNOSIS — Z793 Long term (current) use of hormonal contraceptives: Secondary | ICD-10-CM | POA: Diagnosis not present

## 2015-10-14 DIAGNOSIS — Z980 Intestinal bypass and anastomosis status: Secondary | ICD-10-CM | POA: Insufficient documentation

## 2015-10-14 DIAGNOSIS — Z79899 Other long term (current) drug therapy: Secondary | ICD-10-CM | POA: Diagnosis not present

## 2015-10-14 DIAGNOSIS — F418 Other specified anxiety disorders: Secondary | ICD-10-CM | POA: Diagnosis not present

## 2015-10-14 DIAGNOSIS — M62838 Other muscle spasm: Secondary | ICD-10-CM | POA: Diagnosis present

## 2015-10-14 DIAGNOSIS — F329 Major depressive disorder, single episode, unspecified: Secondary | ICD-10-CM | POA: Insufficient documentation

## 2015-10-14 DIAGNOSIS — Z87891 Personal history of nicotine dependence: Secondary | ICD-10-CM | POA: Diagnosis not present

## 2015-10-14 NOTE — ED Triage Notes (Signed)
Pt states that since this morning she has had spasms in her face and arms. States that she feels generally weak. Neuro intact. Alert and oriented.

## 2015-10-15 ENCOUNTER — Observation Stay (HOSPITAL_COMMUNITY)
Admission: EM | Admit: 2015-10-15 | Discharge: 2015-10-15 | Disposition: A | Discharge status: Home / Self Care | Payer: 59 | Attending: Internal Medicine | Admitting: Internal Medicine

## 2015-10-15 ENCOUNTER — Observation Stay (HOSPITAL_COMMUNITY)
Admit: 2015-10-15 | Discharge: 2015-10-15 | Disposition: A | Discharge status: Home / Self Care | Payer: 59 | Attending: Internal Medicine | Admitting: Internal Medicine

## 2015-10-15 ENCOUNTER — Encounter (HOSPITAL_COMMUNITY): Payer: Self-pay | Admitting: Internal Medicine

## 2015-10-15 ENCOUNTER — Emergency Department (HOSPITAL_COMMUNITY): Payer: 59

## 2015-10-15 DIAGNOSIS — F418 Other specified anxiety disorders: Secondary | ICD-10-CM | POA: Diagnosis present

## 2015-10-15 DIAGNOSIS — G2401 Drug induced subacute dyskinesia: Secondary | ICD-10-CM

## 2015-10-15 DIAGNOSIS — T43225A Adverse effect of selective serotonin reuptake inhibitors, initial encounter: Secondary | ICD-10-CM | POA: Diagnosis not present

## 2015-10-15 DIAGNOSIS — T43225D Adverse effect of selective serotonin reuptake inhibitors, subsequent encounter: Secondary | ICD-10-CM

## 2015-10-15 DIAGNOSIS — R258 Other abnormal involuntary movements: Secondary | ICD-10-CM

## 2015-10-15 DIAGNOSIS — M62838 Other muscle spasm: Secondary | ICD-10-CM

## 2015-10-15 DIAGNOSIS — G9340 Encephalopathy, unspecified: Secondary | ICD-10-CM | POA: Diagnosis not present

## 2015-10-15 LAB — URINALYSIS, ROUTINE W REFLEX MICROSCOPIC
Glucose, UA: NEGATIVE mg/dL
Ketones, ur: 40 mg/dL — AB
NITRITE: NEGATIVE
Protein, ur: NEGATIVE mg/dL
SPECIFIC GRAVITY, URINE: 1.026 (ref 1.005–1.030)
pH: 5.5 (ref 5.0–8.0)

## 2015-10-15 LAB — CBG MONITORING, ED: Glucose-Capillary: 108 mg/dL — ABNORMAL HIGH (ref 65–99)

## 2015-10-15 LAB — BASIC METABOLIC PANEL
ANION GAP: 10 (ref 5–15)
BUN: 10 mg/dL (ref 6–20)
CO2: 23 mmol/L (ref 22–32)
Calcium: 9.3 mg/dL (ref 8.9–10.3)
Chloride: 104 mmol/L (ref 101–111)
Creatinine, Ser: 0.69 mg/dL (ref 0.44–1.00)
Glucose, Bld: 121 mg/dL — ABNORMAL HIGH (ref 65–99)
Potassium: 4 mmol/L (ref 3.5–5.1)
SODIUM: 137 mmol/L (ref 135–145)

## 2015-10-15 LAB — CBC WITH DIFFERENTIAL/PLATELET
BASOS ABS: 0 10*3/uL (ref 0.0–0.1)
BASOS PCT: 0 %
EOS ABS: 0 10*3/uL (ref 0.0–0.7)
EOS PCT: 0 %
HCT: 37.8 % (ref 36.0–46.0)
Hemoglobin: 12.2 g/dL (ref 12.0–15.0)
Lymphocytes Relative: 28 %
Lymphs Abs: 3 10*3/uL (ref 0.7–4.0)
MCH: 27.2 pg (ref 26.0–34.0)
MCHC: 32.3 g/dL (ref 30.0–36.0)
MCV: 84.2 fL (ref 78.0–100.0)
MONO ABS: 0.5 10*3/uL (ref 0.1–1.0)
Monocytes Relative: 4 %
Neutro Abs: 7.4 10*3/uL (ref 1.7–7.7)
Neutrophils Relative %: 68 %
PLATELETS: 547 10*3/uL — AB (ref 150–400)
RBC: 4.49 MIL/uL (ref 3.87–5.11)
RDW: 14.3 % (ref 11.5–15.5)
WBC: 10.9 10*3/uL — ABNORMAL HIGH (ref 4.0–10.5)

## 2015-10-15 LAB — RAPID URINE DRUG SCREEN, HOSP PERFORMED
Amphetamines: NOT DETECTED
Barbiturates: NOT DETECTED
Benzodiazepines: NOT DETECTED
COCAINE: NOT DETECTED
OPIATES: NOT DETECTED
Tetrahydrocannabinol: NOT DETECTED

## 2015-10-15 LAB — URINE MICROSCOPIC-ADD ON

## 2015-10-15 LAB — SALICYLATE LEVEL: Salicylate Lvl: <4 mg/dL (ref 2.8–30.0)

## 2015-10-15 LAB — CALCIUM: CALCIUM: 9 mg/dL (ref 8.9–10.3)

## 2015-10-15 LAB — MAGNESIUM: Magnesium: 2.1 mg/dL (ref 1.7–2.4)

## 2015-10-15 LAB — ACETAMINOPHEN LEVEL: Acetaminophen (Tylenol), Serum: <10 ug/mL — ABNORMAL LOW (ref 10–30)

## 2015-10-15 LAB — HCG, QUANTITATIVE, PREGNANCY

## 2015-10-15 MED ORDER — ALPRAZOLAM 0.5 MG PO TABS: 0.5000 mg | ORAL_TABLET | Freq: Three times a day (TID) | ORAL | 0 refills | Status: AC | PRN

## 2015-10-15 MED ORDER — SODIUM CHLORIDE 0.9 % IV SOLN
INTRAVENOUS | Status: DC
  Administered 2015-10-15: 1000 mL via INTRAVENOUS

## 2015-10-15 MED ORDER — LORAZEPAM 2 MG/ML IJ SOLN
1.0000 mg | Freq: Once | INTRAMUSCULAR | Status: AC
  Administered 2015-10-15: 1 mg via INTRAVENOUS
  Filled 2015-10-15: qty 1

## 2015-10-15 MED ORDER — ENOXAPARIN SODIUM 40 MG/0.4ML ~~LOC~~ SOLN: 40.0000 mg | SUBCUTANEOUS | Status: DC

## 2015-10-15 MED ORDER — ONDANSETRON HCL 4 MG/2ML IJ SOLN: 4.0000 mg | Freq: Four times a day (QID) | INTRAMUSCULAR | Status: DC | PRN

## 2015-10-15 MED ORDER — LORAZEPAM 2 MG/ML IJ SOLN: 0.5000 mg | Freq: Four times a day (QID) | INTRAMUSCULAR | Status: DC | PRN

## 2015-10-15 MED ORDER — SODIUM CHLORIDE 0.9 % IV BOLUS (SEPSIS)
1000.0000 mL | Freq: Once | INTRAVENOUS | Status: AC
Start: 2015-10-15 — End: 2015-10-15
  Administered 2015-10-15: 1000 mL via INTRAVENOUS

## 2015-10-15 MED ORDER — ONDANSETRON HCL 4 MG PO TABS: 4.0000 mg | ORAL_TABLET | Freq: Four times a day (QID) | ORAL | Status: DC | PRN

## 2015-10-15 MED ORDER — ACETAMINOPHEN 650 MG RE SUPP: 650.0000 mg | Freq: Four times a day (QID) | RECTAL | Status: DC | PRN

## 2015-10-15 MED ORDER — ACETAMINOPHEN 325 MG PO TABS: 650.0000 mg | ORAL_TABLET | Freq: Four times a day (QID) | ORAL | Status: DC | PRN

## 2015-10-15 MED ORDER — CYCLOBENZAPRINE HCL 10 MG PO TABS: 10.0000 mg | ORAL_TABLET | Freq: Three times a day (TID) | ORAL | Status: DC | PRN

## 2015-10-15 MED ORDER — NORETHIN-ETH ESTRADIOL-FE 0.4-35 MG-MCG PO CHEW: 1.0000 | CHEWABLE_TABLET | Freq: Every evening | ORAL | Status: DC

## 2015-10-15 NOTE — ED Notes (Signed)
Called floor to give report, was told nurse would call me back.

## 2015-10-15 NOTE — Progress Notes (Signed)
Pt confirms pcp is [facility] updated Pt inquired about eating Pt informed Cm would check with her ED RN  Speech therapy came in to see pt as ED CM was leaving out to complete swallowing test per ED RN prior to pt being able to have po intake

## 2015-10-15 NOTE — Discharge Summary (Signed)
Physician Discharge Summary  Valerie Oconnor [id number] DOB: 10/10/1980 DOA: 10/15/2015  PCP: Jonathon Bellows, MD  Admit date: 10/15/2015 Discharge date: 10/15/2015  Admitted From: home  Disposition:  Home with supervision from family  Recommendations for Outpatient Follow-up:  Follow up with PCP tomorrow.  Patient has now experienced tardive dyskinesia/myoclonus twice.  Will need bridging to another agent vs. Management of withdrawal symptoms at discretion of PCP and close follow up regarding TD symptoms.  Given 1-day prescription for xanax to use for symptom management.     Home Health:  none  Equipment/Devices:  None  Discharge Condition:  Stable, improved CODE STATUS:  full  Diet recommendation:  regular   Brief/Interim Summary:  Valerie Oconnor is a 35 y.o. female with medical history significant of depression, anxiety, obesity, kidney stone, who presented with jerking movements of her extremities, AMS and memory loss.  Per her boyfriend, patient was noted to have jerking movement in her extremities at about 6:30 the morning prior to admission. She was also mildly confused. Symptoms worsened and she was brought to the ER by her mother and boyfriend the morning of admission.  She had movements consistent with tardive dyskinesia and myoclonus and had mild hypertension.  She had no fever.  UDS was negative.  CT head unremarkable.  EEG demonstrated diffuse slowing but no evidence of epileptiform activity.  Bloodwork did not reveal obvious electrolyte or organ dysfunction that would explain her symptoms.  Initially she was confused, but later admitted she had been noncompliant with her wellbutrin, cymbalta and other medications until the last couple of days when she started taking them again.  She denied anticholinergic intoxication and intentional overdose.  Her Wellbutrin, Flexeril, and Cymbalta were discontinued.  She was given a dose of ativan.  Her thinking gradually cleared and her  muscle twitches improved.  She remembered she had a similar reaction to her cymbalta when she was in college which self-resolved.  She and her family requested discharge with close follow up with patient's primary care doctor.    Discharge Diagnoses:  Principal Problem:   Jerking movements of extremities Active Problems:   Depression with anxiety   Acute encephalopathy   Adverse reaction to selective serotonin reuptake inhibitor   Adverse reaction to SNRI, with confusion and dyskinesia.  Wellbutrin, Flexeril, and Cymbalta were discontinued.  She will likely experience withdrawal symptoms from stopping her SNRI and will need to bridge to another agent vs. Symptom management.  I will defer this to her primary care doctor.  She may use xanax prn for muscle twitches and she was given a prescription for 4 tabs.  She will be supervised and was advised not to drive until follow up with her PCP tomorrow.    Discharge Instructions  Discharge Instructions    Call MD for:  difficulty breathing, headache or visual disturbances    Complete by:  As directed    Call MD for:  extreme fatigue    Complete by:  As directed    Call MD for:  hives    Complete by:  As directed    Call MD for:  persistant dizziness or light-headedness    Complete by:  As directed    Call MD for:  persistant nausea and vomiting    Complete by:  As directed    Call MD for:  severe uncontrolled pain    Complete by:  As directed    Call MD for:  temperature >100.4    Complete  by:  As directed    Diet general    Complete by:  As directed    Driving Restrictions    Complete by:  As directed    No driving or operating heavy machinery until cleared by your primary care doctor.   Increase activity slowly    Complete by:  As directed        Medication List    STOP taking these medications   buPROPion 300 MG 24 hr tablet Commonly known as:  WELLBUTRIN XL   cyclobenzaprine 10 MG tablet Commonly known as:  FLEXERIL    DULoxetine 60 MG capsule Commonly known as:  CYMBALTA   HYDROmorphone 4 MG tablet Commonly known as:  DILAUDID   traMADol 50 MG tablet Commonly known as:  ULTRAM     TAKE these medications   ALPRAZolam 0.5 MG tablet Commonly known as:  XANAX Take 1 tablet (0.5 mg total) by mouth 3 (three) times daily as needed for anxiety (or muscle twitches). What changed:  when to take this  reasons to take this   ibuprofen 600 MG tablet Commonly known as:  ADVIL,MOTRIN Take 1 tablet (600 mg total) by mouth every 6 (six) hours as needed (mild pain).   Norethin-Eth Estradiol-Fe 0.4-35 MG-MCG tablet Commonly known as:  FEMCON FE,WYMZYA FE,ZENCHENT FE,ZEOSA Chew 1 tablet by mouth every evening.   oxyCODONE-acetaminophen 10-325 MG tablet Commonly known as:  PERCOCET Take 1 tablet by mouth every 4 (four) hours as needed for pain.   zolpidem 10 MG tablet Commonly known as:  AMBIEN Take 10 mg by mouth at bedtime as needed for sleep.      Follow-up Information    WEBB, Valla Leaver, MD Follow up on 10/16/2015.   Specialty:  Family Medicine Why:  4:30pm Contact information: [address] [place] [address] [phone]          Allergies  Allergen Reactions  . Ceclor [Cefaclor] Hives    Consultations: None  Procedures/Studies: Ct Head Wo Contrast  Result Date: 10/15/2015 CLINICAL DATA:  Memory loss. Spasms to face and arms. General weakness. EXAM: CT HEAD WITHOUT CONTRAST TECHNIQUE: Contiguous axial images were obtained from the base of the skull through the vertex without intravenous contrast. COMPARISON:  None. FINDINGS: Brain: No evidence of acute infarction, hemorrhage, hydrocephalus, extra-axial collection or mass lesion/mass effect. Vascular: No hyperdense vessel or unexpected calcification. Skull: Normal. Negative for fracture or focal lesion. Sinuses/Orbits: Retention cyst in the left maxillary antrum. Paranasal sinuses and mastoid air cells otherwise  not opacified. Other: None. IMPRESSION: No acute intracranial abnormalities. Electronically Signed   By: Lucienne Capers M.D.   On: 10/15/2015 04:30   EEG.  There is mild to moderate generalized slowing of brain activity which is non-specific but may be due to metabolic, toxic, infectious, or hypoxic derangements.  Clinical correlation is recommended.  The patient is not in non-convulsive status epilepticus.     Subjective: Feeling better.  Twitching has improved.  Memory is coming back.  Asking to go home.    Discharge Exam: Vitals:   10/15/15 1400 10/15/15 1555  BP: 128/86 (!) 142/106  Pulse:  91  Resp: 25 18  Temp:     Vitals:   10/15/15 1329 10/15/15 1330 10/15/15 1400 10/15/15 1555  BP: 135/93 (!) 118/104 128/86 (!) 142/106  Pulse: 92 90  91  Resp: 23 26 25 18   Temp:      TempSrc:      SpO2: 96% 96%  Weight:      Height:        General: Pt is alert, awake, not in acute distress Cardiovascular: RRR, S1/S2 +, no rubs, no gallops Respiratory: CTA bilaterally, no wheezing, no rhonchi Abdominal: Soft, NT, ND, bowel sounds + Extremities: no edema, no cyanosis Neuro:  CN II-XII grossly intact, strength 5/5, sensation intact to light touch.  Reflexes 1+ throughout.  No clonus.  Occasional muscle twitches, particularly noticeable around the mouth and face, but occasionally in bilateral arms.   Psych:  A&Ox4.      The results of significant diagnostics from this hospitalization (including imaging, microbiology, ancillary and laboratory) are listed below for reference.     Microbiology: No results found for this or any previous visit (from the past 240 hour(s)).   Labs: BNP (last 3 results) No results for input(s): BNP in the last 8760 hours. Basic Metabolic Panel:  Recent Labs Lab 10/15/15 0049 10/15/15 0459  NA 137  --   K 4.0  --   CL 104  --   CO2 23  --   GLUCOSE 121*  --   BUN 10  --   CREATININE 0.69  --   CALCIUM 9.3 9.0  MG  --  2.1   Liver  Function Tests: No results for input(s): AST, ALT, ALKPHOS, BILITOT, PROT, ALBUMIN in the last 168 hours. No results for input(s): LIPASE, AMYLASE in the last 168 hours. No results for input(s): AMMONIA in the last 168 hours. CBC:  Recent Labs Lab 10/15/15 0049  WBC 10.9*  NEUTROABS 7.4  HGB 12.2  HCT 37.8  MCV 84.2  PLT 547*   Cardiac Enzymes: No results for input(s): CKTOTAL, CKMB, CKMBINDEX, TROPONINI in the last 168 hours. BNP: Invalid input(s): POCBNP CBG:  Recent Labs Lab 10/15/15 0816  GLUCAP 108*   D-Dimer No results for input(s): DDIMER in the last 72 hours. Hgb A1c No results for input(s): HGBA1C in the last 72 hours. Lipid Profile No results for input(s): CHOL, HDL, LDLCALC, TRIG, CHOLHDL, LDLDIRECT in the last 72 hours. Thyroid function studies No results for input(s): TSH, T4TOTAL, T3FREE, THYROIDAB in the last 72 hours.  Invalid input(s): FREET3 Anemia work up No results for input(s): VITAMINB12, FOLATE, FERRITIN, TIBC, IRON, RETICCTPCT in the last 72 hours. Urinalysis    Component Value Date/Time   COLORURINE AMBER (A) 10/15/2015 0851   APPEARANCEUR CLOUDY (A) 10/15/2015 0851   LABSPEC 1.026 10/15/2015 0851   PHURINE 5.5 10/15/2015 0851   GLUCOSEU NEGATIVE 10/15/2015 0851   HGBUR TRACE (A) 10/15/2015 0851   BILIRUBINUR MODERATE (A) 10/15/2015 0851   KETONESUR 40 (A) 10/15/2015 0851   PROTEINUR NEGATIVE 10/15/2015 0851   NITRITE NEGATIVE 10/15/2015 0851   LEUKOCYTESUR SMALL (A) 10/15/2015 0851   Sepsis Labs Invalid input(s): PROCALCITONIN,  WBC,  LACTICIDVEN   Time coordinating discharge: Over 30 minutes  SIGNED:   Janece Canterbury, MD  [facility] 10/15/2015, 4:02 PM Pager   If 7PM-7AM, please contact night-coverage [email or web address] Password [email or web address]

## 2015-10-15 NOTE — H&P (Signed)
History and Physical    Valerie Oconnor [id number] DOB: 03-07-1980 DOA: 10/15/2015  Referring MD/NP/PA:   PCP: No PCP Per Patient   Patient coming from:  The patient is coming from home.  At baseline, pt is independent for most of ADL.   Chief Complaint: Jerking movements of extremities, AMS and memory loss  HPI: Valerie Oconnor is a 35 y.o. female with medical history significant of depression, anxiety, obesity, kidney stone, who presents with jerking movements of her extremities, AMS and memory loss  Per her boyfriend, patient was noted to have jerking movement in her extremities at about 6:30 yesterday morning. She seems to be mildly confused. Symptoms have been fairly constant throughout the day. Jerking are worse when she is trying to remain still. She has had difficulty with grip strength today. No recent head injury or changes to medications. She is taking Xanax, Wellbutrin, Flexeril, Cymbalta and ambien. Patient has a lot of stress from personal life. She is not working per her mother. She strongly denies drug abuse. Not drinking alcohol. She has memory loss and is unable to recall her birthday. Patient denies vision change or hearing loss. Denies fever, chills, chest pain, shortness breath, cough, nausea, vomiting, abdominal pain, symptoms of UTI. Of note, pt is doing physical therapy "Dry needling" for back pain.   ED Course: pt was found to have WBC 10.9, electrolytes and renal function okay, calcium 9.3, magnesium 2.1, temperature normal, blood pressure elevated at 164/88. CT head is negative for acute intracranial abnormalities. Patient is placed on MedSurg bed for observation.  Review of Systems: Could not be reviewed due to altered mental status  Allergy:  Allergies  Allergen Reactions  . Ceclor [Cefaclor] Hives    Past Medical History:  Diagnosis Date  . Adnexal mass    RIGHT OVARY  . Depression with anxiety   . History of kidney stones   . Wears contact lenses      Past Surgical History:  Procedure Laterality Date  . ADENOIDECTOMY  1992  . EXPLORATORY LAPAROTOMY WITH LYSIS ADHESIONS AND REMOVAL OVARIAN MASS  2011  . EXTRACORPOREAL SHOCK WAVE LITHOTRIPSY  2010  . INGUINAL HERNIA REPAIR Bilateral 1985  . KNEE ARTHROSCOPY Bilateral LEFT X1  2001///   RIGHT  2002  &  2004  . LAPAROSCOPIC GASTRIC BANDING  2007  . LAPAROTOMY Right 07/22/2013   Procedure: LAPAROTOMY, REMOVAL OF RIGHT OVARIAN MASS FROZEN SECTION, peritoneal washings;  Surgeon: Cyril Mourning, MD;  Location: [facility];  Service: Gynecology;  Laterality: Right;  . TONSILLECTOMY  1993  . TYMPANOSTOMY TUBE PLACEMENT Bilateral X6  sets  LAST ONE  1992  . VENTRAL HERNIA REPAIR  2012    Social History:  reports that she has quit smoking. Her smoking use included Cigarettes. She has a 0.75 pack-year smoking history. She quit smokeless tobacco use about 10 months ago. She reports that she does not drink alcohol or use drugs.  Family History: Could not be reviewed due to altered mental status and memory loss.  Prior to Admission medications   Medication Sig Start Date End Date Taking? Authorizing Provider  ALPRAZolam Duanne Moron) 0.5 MG tablet Take 0.5 mg by mouth at bedtime as needed for anxiety.   Yes Historical Provider, MD  buPROPion (WELLBUTRIN XL) 300 MG 24 hr tablet Take 300 mg by mouth daily.   Yes Historical Provider, MD  cyclobenzaprine (FLEXERIL) 10 MG tablet Take 10 mg by mouth 3 (three) times daily as needed for  muscle spasms.   Yes Historical Provider, MD  DULoxetine (CYMBALTA) 60 MG capsule Take 60 mg by mouth every evening.   Yes Historical Provider, MD  Norethin-Eth Estradiol-Fe [facility] FE,WYMZYA Orrin Brigham) 0.4-35 MG-MCG tablet Chew 1 tablet by mouth every evening.   Yes Historical Provider, MD  oxyCODONE-acetaminophen (PERCOCET) 10-325 MG per tablet Take 1 tablet by mouth every 4 (four) hours as needed for pain.   Yes Historical Provider, MD  zolpidem  (AMBIEN) 10 MG tablet Take 10 mg by mouth at bedtime as needed for sleep.   Yes Historical Provider, MD  HYDROmorphone (DILAUDID) 4 MG tablet Take 1 tablet (4 mg total) by mouth every 3 (three) hours as needed for severe pain. Patient not taking: Reported on 09/03/2015 07/23/13   Dian Queen, MD  ibuprofen (ADVIL,MOTRIN) 600 MG tablet Take 1 tablet (600 mg total) by mouth every 6 (six) hours as needed (mild pain). Patient not taking: Reported on 10/15/2015 07/23/13   Dian Queen, MD  traMADol (ULTRAM) 50 MG tablet Take 1 tablet (50 mg total) by mouth every 6 (six) hours as needed for moderate pain. Patient not taking: Reported on 09/03/2015 07/23/13   Dian Queen, MD    Physical Exam: Vitals:   10/14/15 2345 10/15/15 0510  BP: (!) 150/109 (!) 149/106  Pulse: 99 92  Resp: 20 20  Temp: 99 F (37.2 C) 99 F (37.2 C)  TempSrc: Oral Oral  SpO2: 98% 100%   General: Not in acute distress HEENT:       Eyes: PERRL, EOMI, no scleral icterus.       ENT: No discharge from the ears and nose, no pharynx injection, no tonsillar enlargement.        Neck: No JVD, no bruit, no mass felt. Heme: No neck lymph node enlargement. Cardiac: S1/S2, RRR, No murmurs, No gallops or rubs. Respiratory:  No rales, wheezing, rhonchi or rubs. GI: Soft, nondistended, nontender, no rebound pain, no organomegaly, BS present. GU: No hematuria Ext: No pitting leg edema bilaterally. 2+DP/PT pulse bilaterally. Musculoskeletal: No joint deformities, No joint redness or warmth, no limitation of ROM in spin. Skin: No rashes.  Neuro: Alert, oriented to place, but not to person and time. Has jerking movement. Cranial nerves II-XII grossly intact, Muscle strength 5/5 in all extremities, sensation to light touch intact.  Psych: Patient is not psychotic, no suicidal or hemocidal ideation.  Labs on Admission: I have personally reviewed following labs and imaging studies  CBC:  Recent Labs Lab 10/15/15 0049  WBC  10.9*  NEUTROABS 7.4  HGB 12.2  HCT 37.8  MCV 84.2  PLT 123XX123*   Basic Metabolic Panel:  Recent Labs Lab 10/15/15 0049 10/15/15 0459  NA 137  --   K 4.0  --   CL 104  --   CO2 23  --   GLUCOSE 121*  --   BUN 10  --   CREATININE 0.69  --   CALCIUM 9.3 9.0  MG  --  2.1   GFR: CrCl cannot be calculated (Unknown ideal weight.). Liver Function Tests: No results for input(s): AST, ALT, ALKPHOS, BILITOT, PROT, ALBUMIN in the last 168 hours. No results for input(s): LIPASE, AMYLASE in the last 168 hours. No results for input(s): AMMONIA in the last 168 hours. Coagulation Profile: No results for input(s): INR, PROTIME in the last 168 hours. Cardiac Enzymes: No results for input(s): CKTOTAL, CKMB, CKMBINDEX, TROPONINI in the last 168 hours. BNP (last 3 results) No results for input(s): PROBNP  in the last 8760 hours. HbA1C: No results for input(s): HGBA1C in the last 72 hours. CBG: No results for input(s): GLUCAP in the last 168 hours. Lipid Profile: No results for input(s): CHOL, HDL, LDLCALC, TRIG, CHOLHDL, LDLDIRECT in the last 72 hours. Thyroid Function Tests: No results for input(s): TSH, T4TOTAL, FREET4, T3FREE, THYROIDAB in the last 72 hours. Anemia Panel: No results for input(s): VITAMINB12, FOLATE, FERRITIN, TIBC, IRON, RETICCTPCT in the last 72 hours. Urine analysis: No results found for: COLORURINE, APPEARANCEUR, LABSPEC, PHURINE, GLUCOSEU, HGBUR, BILIRUBINUR, KETONESUR, PROTEINUR, UROBILINOGEN, NITRITE, LEUKOCYTESUR Sepsis Labs: @LABRCNTIP (procalcitonin:4,lacticidven:4) )No results found for this or any previous visit (from the past 240 hour(s)).   Radiological Exams on Admission: Ct Head Wo Contrast  Result Date: 10/15/2015 CLINICAL DATA:  Memory loss. Spasms to face and arms. General weakness. EXAM: CT HEAD WITHOUT CONTRAST TECHNIQUE: Contiguous axial images were obtained from the base of the skull through the vertex without intravenous contrast. COMPARISON:   None. FINDINGS: Brain: No evidence of acute infarction, hemorrhage, hydrocephalus, extra-axial collection or mass lesion/mass effect. Vascular: No hyperdense vessel or unexpected calcification. Skull: Normal. Negative for fracture or focal lesion. Sinuses/Orbits: Retention cyst in the left maxillary antrum. Paranasal sinuses and mastoid air cells otherwise not opacified. Other: None. IMPRESSION: No acute intracranial abnormalities. Electronically Signed   By: Lucienne Capers M.D.   On: 10/15/2015 04:30     EKG: Not done in ED, will get one.   Assessment/Plan Principal Problem:   Jerking movements of extremities Active Problems:   Depression with anxiety   Acute encephalopathy   Adverse reaction to selective serotonin reuptake inhibitor   Jerking movements of extremities, encephalopathy and memory loss: Etiology is not clear. No signs of infection. CT head is negative for acute intracranial abnormalities. Possible DD is adverse reaction to selective serotonin reuptake inhibitor and side effect of Flexeril, drug abuse. EDP discussed with neurologist, Dr. Nicole Kindred, who did not think patient has seizure.  -will place on med-surg bed for obs -hold Xanax, Wellbutrin, Flexeril, Cymbalta and Ambien -EEG -check UDS, UA, tylenol level, salicylate level -IV fluids: 1 L normal saline, then 1 25 mL per hour  Depression with anxiety: -hold home meds as above -start prn Ativan for agitation.   DVT ppx: SQ Lovenox Code Status: Full code Family Communication: Yes, patient's  boyfriend  at bed side Disposition Plan:  Anticipate discharge back to previous home environment Consults called:  EDP discussed that with neurologist, Dr. Nicole Kindred Admission status: medical floor/obs   Date of Service 10/15/2015    Ivor Costa [facility] Pager [phone]  If 7PM-7AM, please contact night-coverage [email or web address] Password [facility] 10/15/2015, 6:33 AM

## 2015-10-15 NOTE — ED Provider Notes (Signed)
[name] Provider Note   CSN: [id number] Arrival date & time: 10/14/15  2327    History   Chief Complaint Chief Complaint  Patient presents with  . Spasms    HPI Valerie Oconnor is a 35 y.o. female.  35 year old female with a history of kidney stones and adnexal mass presents to the emergency department for evaluation of involuntary movements. Husband states that he noticed symptoms at approximately 6:30 AM yesterday. Symptoms have been fairly constant throughout the day. Patient notices that they are worse when she is trying to remain still and they slightly lessened with purposeful movements. She has had difficulty with grip strength today, per husband. She denies any pain associated with her symptoms. No recent head injury or changes to medications. There is a family history of brain tumor in her father who passed away proximally 2 years ago. Family is most concerned about associated memory loss. Patient is oriented to self and family. She is unable to recall her birthday, the month, or the current president. Patient has not had any nausea or vomiting. No recent fevers. No associated syncope.   The history is provided by the patient. No language interpreter was used.    Past Medical History:  Diagnosis Date  . Adnexal mass    RIGHT OVARY  . History of kidney stones   . Wears contact lenses     Patient Active Problem List   Diagnosis Date Noted  . Adverse effect of selective serotonin reuptake inhibitor 10/15/2015  . Fibroid 07/22/2013    Past Surgical History:  Procedure Laterality Date  . ADENOIDECTOMY  1992  . EXPLORATORY LAPAROTOMY WITH LYSIS ADHESIONS AND REMOVAL OVARIAN MASS  2011  . EXTRACORPOREAL SHOCK WAVE LITHOTRIPSY  2010  . INGUINAL HERNIA REPAIR Bilateral 1985  . KNEE ARTHROSCOPY Bilateral LEFT X1  2001///   RIGHT  2002  &  2004  . LAPAROSCOPIC GASTRIC BANDING  2007  . LAPAROTOMY Right 07/22/2013   Procedure: LAPAROTOMY, REMOVAL OF RIGHT  OVARIAN MASS FROZEN SECTION, peritoneal washings;  Surgeon: Cyril Mourning, MD;  Location: [facility];  Service: Gynecology;  Laterality: Right;  . TONSILLECTOMY  1993  . TYMPANOSTOMY TUBE PLACEMENT Bilateral X6  sets  LAST ONE  1992  . VENTRAL HERNIA REPAIR  2012    OB History    No data available       Home Medications    Prior to Admission medications   Medication Sig Start Date End Date Taking? Authorizing Provider  ALPRAZolam Duanne Moron) 0.5 MG tablet Take 0.5 mg by mouth at bedtime as needed for anxiety.    Historical Provider, MD  buPROPion (WELLBUTRIN XL) 300 MG 24 hr tablet Take 300 mg by mouth daily.    Historical Provider, MD  cyclobenzaprine (FLEXERIL) 10 MG tablet Take 10 mg by mouth 3 (three) times daily as needed for muscle spasms.    Historical Provider, MD  DULoxetine (CYMBALTA) 60 MG capsule Take 60 mg by mouth every evening.    Historical Provider, MD  HYDROmorphone (DILAUDID) 4 MG tablet Take 1 tablet (4 mg total) by mouth every 3 (three) hours as needed for severe pain. Patient not taking: Reported on 09/03/2015 07/23/13   Dian Queen, MD  ibuprofen (ADVIL,MOTRIN) 600 MG tablet Take 1 tablet (600 mg total) by mouth every 6 (six) hours as needed (mild pain). 07/23/13   Dian Queen, MD  Norethin-Eth Estradiol-Fe [facility] FE,WYMZYA Orrin Brigham) 0.4-35 MG-MCG tablet Chew 1 tablet by mouth every evening.  Historical Provider, MD  oxyCODONE-acetaminophen (PERCOCET) 10-325 MG per tablet Take 1 tablet by mouth every 4 (four) hours as needed for pain.    Historical Provider, MD  traMADol (ULTRAM) 50 MG tablet Take 1 tablet (50 mg total) by mouth every 6 (six) hours as needed for moderate pain. Patient not taking: Reported on 09/03/2015 07/23/13   Dian Queen, MD  zolpidem (AMBIEN) 10 MG tablet Take 10 mg by mouth at bedtime as needed for sleep.    Historical Provider, MD    Family History History reviewed. No pertinent family  history.  Social History Social History  Substance Use Topics  . Smoking status: Former Smoker    Packs/day: 0.25    Years: 3.00    Types: Cigarettes  . Smokeless tobacco: Former Systems developer    Quit date: 12/04/2014     Comment: [place]  . Alcohol use No     Allergies   Ceclor [cefaclor]   Review of Systems Review of Systems Ten systems reviewed and are negative for acute change, except as noted in the HPI.    Physical Exam Updated Vital Signs BP (!) 149/106 (BP Location: Left Arm)   Pulse 92   Temp 99 F (37.2 C) (Oral)   Resp 20   LMP 09/25/2015 (Approximate)   SpO2 100%   Physical Exam  Constitutional: She appears well-developed and well-nourished. No distress.  Nontoxic appearing, in NAD  HENT:  Head: Normocephalic and atraumatic.  Eyes: Conjunctivae and EOM are normal. No scleral icterus.  Neck: Normal range of motion.  Cardiovascular: Normal rate, regular rhythm and intact distal pulses.   Pulmonary/Chest: Effort normal. No respiratory distress. She has no wheezes.  Respirations even and unlabored  Musculoskeletal: Normal range of motion.  Neurological: She is alert.  Seemingly involuntary twitching to face and extremities. No tremors. No focal deficits noted, however. Patient reports equal sensation. Grip strength 5/5 bilaterally. Patient ambulatory in the ED independently.   Skin: Skin is warm and dry. No rash noted. She is not diaphoretic. No erythema. No pallor.  Psychiatric: Her behavior is normal.  Mildly anxious  Nursing note and vitals reviewed.    ED Treatments / Results  Labs (all labs ordered are listed, but only abnormal results are displayed) Labs Reviewed  CBC WITH DIFFERENTIAL/PLATELET - Abnormal; Notable for the following:       Result Value   WBC 10.9 (*)    Platelets 547 (*)    All other components within normal limits  BASIC METABOLIC PANEL - Abnormal; Notable for the following:    Glucose, Bld 121 (*)    All other components within  normal limits  MAGNESIUM  CALCIUM    EKG  EKG Interpretation None       Radiology Ct Head Wo Contrast  Result Date: 10/15/2015 CLINICAL DATA:  Memory loss. Spasms to face and arms. General weakness. EXAM: CT HEAD WITHOUT CONTRAST TECHNIQUE: Contiguous axial images were obtained from the base of the skull through the vertex without intravenous contrast. COMPARISON:  None. FINDINGS: Brain: No evidence of acute infarction, hemorrhage, hydrocephalus, extra-axial collection or mass lesion/mass effect. Vascular: No hyperdense vessel or unexpected calcification. Skull: Normal. Negative for fracture or focal lesion. Sinuses/Orbits: Retention cyst in the left maxillary antrum. Paranasal sinuses and mastoid air cells otherwise not opacified. Other: None. IMPRESSION: No acute intracranial abnormalities. Electronically Signed   By: Lucienne Capers M.D.   On: 10/15/2015 04:30    Procedures Procedures (including critical care time)   Medications Ordered  in ED Medications  sodium chloride 0.9 % bolus 1,000 mL (not administered)     Initial Impression / Assessment and Plan / ED Course  I have reviewed the triage vital signs and the nursing notes.  Pertinent labs & imaging results that were available during my care of the patient were reviewed by me and considered in my medical decision making (see chart for details).  Clinical Course    35 year old female presents to the emergency department for evaluation of involuntary muscle spasms with associated memory loss. Patient with no electrolyte abnormalities. CT head is reassuring. Symptoms have been ongoing for approximately 24 hours. Case discussed with Dr. Nicole Kindred of neurology who believes that symptoms may be secondary to SSRI medications. Plan to admit to hospitalist service for further monitoring and management. Case discussed with Dr. Blaine Hamper of [facility]. Temporary admission orders placed.   Final Clinical Impressions(s) / ED Diagnoses   Final  diagnoses:  Muscle spasm  Adverse reaction to selective serotonin reuptake inhibitor, initial encounter    New Prescriptions New Prescriptions   No medications on file     Antonietta Breach, PA-C 10/15/15 [id number]    April Palumbo, MD 10/15/15 [phone]

## 2015-10-15 NOTE — ED Notes (Signed)
Call placed with Dr. Sheran Fava to see patient.  Patient stated she did not wish to be admitted and wants to go home.

## 2015-10-15 NOTE — Procedures (Signed)
EEG Report  Clinical history:  Jerking movements of the extremities  Technical Summary: A 19 channel digitial EEG recording was performed using the 10-20 international system of electrode placement.  Bipolar and Referential montages were used.  The total recording time was about 20 minutes.  Description:  With eyes closed and awake, posterior dominant frequencies are about 6-7 Hz and symmetrical in both hemispheres.  Hyperventilation and photic stimulation were not performed.  Sleep was not recorded.  There were no epileptiform discharges or electrographic seizures present.     Impression:  This is an abnormal EEG.  There is mild to moderate generalized slowing of brain activity which is non-specific but may be due to metabolic, toxic, infectious, or hypoxic derangements.  Clinical correlation is recommended.  The patient is not in non-convulsive status epilepticus.    Rogue Jury, MD

## 2015-10-15 NOTE — ED Notes (Signed)
Report given to Audrey, RN.

## 2015-10-15 NOTE — ED Notes (Addendum)
Vitals completed in discharge condition by Erline Levine, RN.

## 2015-10-15 NOTE — Evaluation (Signed)
Clinical/Bedside Swallow Evaluation Patient Details  Name: Valerie Oconnor MRN: [id number] Date of Birth: Nov 15, 1980  Today's Date: 10/15/2015 Time: SLP Start Time (ACUTE ONLY): 1418 SLP Stop Time (ACUTE ONLY): 1428 SLP Time Calculation (min) (ACUTE ONLY): 10 min  Past Medical History:  Past Medical History:  Diagnosis Date  . Adnexal mass    RIGHT OVARY  . Depression with anxiety   . History of kidney stones   . Wears contact lenses    Past Surgical History:  Past Surgical History:  Procedure Laterality Date  . ADENOIDECTOMY  1992  . EXPLORATORY LAPAROTOMY WITH LYSIS ADHESIONS AND REMOVAL OVARIAN MASS  2011  . EXTRACORPOREAL SHOCK WAVE LITHOTRIPSY  2010  . INGUINAL HERNIA REPAIR Bilateral 1985  . KNEE ARTHROSCOPY Bilateral LEFT X1  2001///   RIGHT  2002  &  2004  . LAPAROSCOPIC GASTRIC BANDING  2007  . LAPAROTOMY Right 07/22/2013   Procedure: LAPAROTOMY, REMOVAL OF RIGHT OVARIAN MASS FROZEN SECTION, peritoneal washings;  Surgeon: Cyril Mourning, MD;  Location: [facility];  Service: Gynecology;  Laterality: Right;  . TONSILLECTOMY  1993  . TYMPANOSTOMY TUBE PLACEMENT Bilateral X6  sets  LAST ONE  1992  . VENTRAL HERNIA REPAIR  2012   HPI:  35 yo female adm to [facility] with involuntary movements of arms/face- ? adverse reaction to [facility].  PMH + for adnexal mass s/p surgery, back pain undergoing dry needling.  Pt is s/p mass removal 2015 and gastric bypass in 07. CT head negative. EEG done.      Assessment / Plan / Recommendation Clinical Impression  Pt presents with functional oropharyngeal swallow ability based on clinical swallow evaluation.  She does have pt appears with lingual spasms upon protrusion, facial spasm on right cheek, boyfriend and mother report this is improved compared to earlier in the day.  Pt had been given Ativan and reports this aided tremor decline.   Observed pt consuming juice, applesauce, cracker, and cheese stick.  Pt uses caution and eats  slowly due to her h/o lap band surgery.  She states her swallowing ability is the same as normal (pointing to distal esophagus re: slower clearance).  Slight tremors did not impact her oral coordination or appear to impact pharyngeal swallow.  No indication of penetration/aspiration with any po intake.    Recommend regular/thin diet.  No SLP follow up.  Thanks.         Aspiration Risk  No limitations    Diet Recommendation Regular;Thin liquid   Liquid Administration via: Cup;Straw Medication Administration: Whole meds with liquid Compensations: Slow rate;Small sips/bites Postural Changes: Seated upright at 90 degrees;Remain upright for at least 30 minutes after po intake    Other  Recommendations Oral Care Recommendations: Oral care BID   Follow up Recommendations        Frequency and Duration            Prognosis        Swallow Study   General Date of Onset: 10/15/15 HPI: 34 yo female adm to [facility] with involuntary movements of arms/face- ? adverse reaction to [facility].  PMH + for adnexal mass s/p surgery, back pain undergoing dry needling.  Pt is s/p mass removal 2015 and gastric bypass in 07. CT head negative. EEG done.    Type of Study: Bedside Swallow Evaluation Diet Prior to this Study: NPO Temperature Spikes Noted: Yes Respiratory Status: Room air History of Recent Intubation: No Behavior/Cognition: Alert;Cooperative;Pleasant mood Oral Cavity Assessment: Within Functional  Limits Oral Care Completed by SLP: No Oral Cavity - Dentition: Adequate natural dentition Vision: Functional for self-feeding Self-Feeding Abilities: Able to feed self Patient Positioning: Upright in bed Baseline Vocal Quality: Normal Volitional Cough: Strong Volitional Swallow: Able to elicit    Oral/Motor/Sensory Function Overall Oral Motor/Sensory Function:  (pt appears with lingual spasm upon protrusion, facial spasm on right cheek, boyfriend and mother report this is improved compared to baseline)    Ice Chips Ice chips: Not tested   Thin Liquid Thin Liquid: Within functional limits Presentation: Straw;Cup    Nectar Thick Nectar Thick Liquid: Not tested   Honey Thick Honey Thick Liquid: Not tested   Puree Puree: Within functional limits Presentation: Self Fed;Spoon   Solid   GO   Solid: Within functional limits Presentation: Self Fed    Functional Assessment Tool Used: clinical judgement Functional Limitations: Swallowing Swallow Current Status [id number]): 0 percent impaired, limited or restricted Swallow Goal Status [id number]): 0 percent impaired, limited or restricted Swallow Discharge Status [phone]): 0 percent impaired, limited or restricted   Claudie Fisherman, [place] [facility] SLP [phone]

## 2015-10-15 NOTE — Progress Notes (Signed)
Offsite EEG completed at [facility] ED.  Results pending.

## 2015-11-30 ENCOUNTER — Ambulatory Visit: Payer: 59 | Admitting: Neurology

## 2015-12-31 ENCOUNTER — Ambulatory Visit: Payer: 59 | Admitting: Neurology

## 2016-02-01 DIAGNOSIS — M25561 Pain in right knee: Secondary | ICD-10-CM | POA: Diagnosis not present

## 2016-02-01 DIAGNOSIS — M545 Low back pain: Secondary | ICD-10-CM | POA: Diagnosis not present

## 2016-02-01 DIAGNOSIS — Z79891 Long term (current) use of opiate analgesic: Secondary | ICD-10-CM | POA: Diagnosis not present

## 2016-02-01 DIAGNOSIS — G894 Chronic pain syndrome: Secondary | ICD-10-CM | POA: Diagnosis not present

## 2016-02-02 ENCOUNTER — Ambulatory Visit: Payer: 59 | Admitting: Neurology

## 2016-03-07 ENCOUNTER — Other Ambulatory Visit: Payer: Self-pay | Admitting: Sports Medicine

## 2016-03-07 DIAGNOSIS — S83281A Other tear of lateral meniscus, current injury, right knee, initial encounter: Secondary | ICD-10-CM | POA: Diagnosis not present

## 2016-03-07 DIAGNOSIS — M25561 Pain in right knee: Secondary | ICD-10-CM

## 2016-03-13 ENCOUNTER — Ambulatory Visit: Payer: 59

## 2016-03-19 ENCOUNTER — Ambulatory Visit
Admission: RE | Admit: 2016-03-19 | Discharge: 2016-03-19 | Disposition: A | Discharge status: Home / Self Care | Payer: 59 | Source: Ambulatory Visit | Attending: Sports Medicine | Admitting: Sports Medicine

## 2016-03-19 DIAGNOSIS — M25561 Pain in right knee: Secondary | ICD-10-CM | POA: Diagnosis not present

## 2016-03-24 DIAGNOSIS — S83289A Other tear of lateral meniscus, current injury, unspecified knee, initial encounter: Secondary | ICD-10-CM

## 2016-03-24 HISTORY — DX: Other tear of lateral meniscus, current injury, unspecified knee, initial encounter: S83.289A

## 2016-03-28 DIAGNOSIS — S83281D Other tear of lateral meniscus, current injury, right knee, subsequent encounter: Secondary | ICD-10-CM | POA: Diagnosis not present

## 2016-04-01 ENCOUNTER — Other Ambulatory Visit: Payer: Self-pay | Admitting: Orthopedic Surgery

## 2016-04-01 ENCOUNTER — Encounter (HOSPITAL_BASED_OUTPATIENT_CLINIC_OR_DEPARTMENT_OTHER): Payer: Self-pay | Admitting: *Deleted

## 2016-04-01 NOTE — Pre-Procedure Instructions (Signed)
To come for anesthesia airway evaluation.

## 2016-04-07 ENCOUNTER — Ambulatory Visit (HOSPITAL_BASED_OUTPATIENT_CLINIC_OR_DEPARTMENT_OTHER)
Admission: RE | Admit: 2016-04-07 | Discharge: 2016-04-07 | Disposition: A | Discharge status: Home / Self Care | Payer: 59 | Source: Ambulatory Visit | Attending: Orthopedic Surgery | Admitting: Orthopedic Surgery

## 2016-04-07 ENCOUNTER — Encounter (HOSPITAL_BASED_OUTPATIENT_CLINIC_OR_DEPARTMENT_OTHER)
Admission: RE | Disposition: A | Discharge status: Home / Self Care | Payer: Self-pay | Source: Ambulatory Visit | Attending: Orthopedic Surgery

## 2016-04-07 ENCOUNTER — Ambulatory Visit (HOSPITAL_BASED_OUTPATIENT_CLINIC_OR_DEPARTMENT_OTHER): Payer: 59 | Admitting: Anesthesiology

## 2016-04-07 ENCOUNTER — Encounter (HOSPITAL_BASED_OUTPATIENT_CLINIC_OR_DEPARTMENT_OTHER): Payer: Self-pay | Admitting: Anesthesiology

## 2016-04-07 DIAGNOSIS — Z79899 Other long term (current) drug therapy: Secondary | ICD-10-CM | POA: Insufficient documentation

## 2016-04-07 DIAGNOSIS — G8929 Other chronic pain: Secondary | ICD-10-CM | POA: Insufficient documentation

## 2016-04-07 DIAGNOSIS — Z6841 Body Mass Index (BMI) 40.0 and over, adult: Secondary | ICD-10-CM | POA: Insufficient documentation

## 2016-04-07 DIAGNOSIS — M545 Low back pain: Secondary | ICD-10-CM | POA: Diagnosis not present

## 2016-04-07 DIAGNOSIS — M23241 Derangement of anterior horn of lateral meniscus due to old tear or injury, right knee: Secondary | ICD-10-CM | POA: Insufficient documentation

## 2016-04-07 DIAGNOSIS — M23261 Derangement of other lateral meniscus due to old tear or injury, right knee: Secondary | ICD-10-CM | POA: Diagnosis not present

## 2016-04-07 DIAGNOSIS — Z888 Allergy status to other drugs, medicaments and biological substances status: Secondary | ICD-10-CM | POA: Insufficient documentation

## 2016-04-07 DIAGNOSIS — G43909 Migraine, unspecified, not intractable, without status migrainosus: Secondary | ICD-10-CM | POA: Diagnosis not present

## 2016-04-07 DIAGNOSIS — Z87891 Personal history of nicotine dependence: Secondary | ICD-10-CM | POA: Diagnosis not present

## 2016-04-07 DIAGNOSIS — S83271A Complex tear of lateral meniscus, current injury, right knee, initial encounter: Secondary | ICD-10-CM | POA: Diagnosis present

## 2016-04-07 DIAGNOSIS — S83281A Other tear of lateral meniscus, current injury, right knee, initial encounter: Secondary | ICD-10-CM | POA: Diagnosis not present

## 2016-04-07 DIAGNOSIS — Z87442 Personal history of urinary calculi: Secondary | ICD-10-CM | POA: Diagnosis not present

## 2016-04-07 DIAGNOSIS — Z9884 Bariatric surgery status: Secondary | ICD-10-CM | POA: Insufficient documentation

## 2016-04-07 HISTORY — DX: Low back pain: M54.5

## 2016-04-07 HISTORY — DX: Complex tear of lateral meniscus, current injury, right knee, initial encounter: S83.271A

## 2016-04-07 HISTORY — DX: Migraine, unspecified, not intractable, without status migrainosus: G43.909

## 2016-04-07 HISTORY — DX: Other chronic pain: G89.29

## 2016-04-07 HISTORY — PX: KNEE ARTHROSCOPY WITH LATERAL MENISECTOMY: SHX6193

## 2016-04-07 HISTORY — DX: Other tear of lateral meniscus, current injury, unspecified knee, initial encounter: S83.289A

## 2016-04-07 HISTORY — DX: Low back pain, unspecified: M54.50

## 2016-04-07 SURGERY — ARTHROSCOPY, KNEE, WITH LATERAL MENISCECTOMY
Anesthesia: General | Site: Knee | Laterality: Right

## 2016-04-07 MED ORDER — KETOROLAC TROMETHAMINE 30 MG/ML IJ SOLN
INTRAMUSCULAR | Status: DC | PRN
  Administered 2016-04-07: 30 mg via INTRAVENOUS

## 2016-04-07 MED ORDER — ONDANSETRON HCL 4 MG PO TABS: 4.0000 mg | ORAL_TABLET | Freq: Three times a day (TID) | ORAL | 0 refills | Status: AC | PRN

## 2016-04-07 MED ORDER — DEXAMETHASONE SODIUM PHOSPHATE 4 MG/ML IJ SOLN
INTRAMUSCULAR | Status: DC | PRN
  Administered 2016-04-07: 10 mg via INTRAVENOUS

## 2016-04-07 MED ORDER — FENTANYL CITRATE (PF) 100 MCG/2ML IJ SOLN
INTRAMUSCULAR | Status: AC
  Filled 2016-04-07: qty 2

## 2016-04-07 MED ORDER — OXYCODONE-ACETAMINOPHEN 5-325 MG PO TABS: 1.0000 | ORAL_TABLET | Freq: Four times a day (QID) | ORAL | 0 refills | Status: AC | PRN

## 2016-04-07 MED ORDER — BUPIVACAINE HCL (PF) 0.5 % IJ SOLN
INTRAMUSCULAR | Status: AC
  Filled 2016-04-07: qty 30

## 2016-04-07 MED ORDER — DEXAMETHASONE SODIUM PHOSPHATE 10 MG/ML IJ SOLN
INTRAMUSCULAR | Status: AC
  Filled 2016-04-07: qty 1

## 2016-04-07 MED ORDER — SCOPOLAMINE 1 MG/3DAYS TD PT72: 1.0000 | MEDICATED_PATCH | Freq: Once | TRANSDERMAL | Status: DC | PRN

## 2016-04-07 MED ORDER — PROMETHAZINE HCL 25 MG/ML IJ SOLN: 6.2500 mg | INTRAMUSCULAR | Status: DC | PRN

## 2016-04-07 MED ORDER — OXYCODONE HCL 5 MG/5ML PO SOLN: 5.0000 mg | Freq: Once | ORAL | Status: AC | PRN

## 2016-04-07 MED ORDER — SODIUM CHLORIDE 0.9 % IR SOLN
Status: DC | PRN
  Administered 2016-04-07: 3000 mL

## 2016-04-07 MED ORDER — HYDROMORPHONE HCL 1 MG/ML IJ SOLN
0.2500 mg | INTRAMUSCULAR | Status: DC | PRN
  Administered 2016-04-07: 0.5 mg via INTRAVENOUS

## 2016-04-07 MED ORDER — CEFAZOLIN SODIUM-DEXTROSE 2-4 GM/100ML-% IV SOLN: 2.0000 g | INTRAVENOUS | Status: DC

## 2016-04-07 MED ORDER — HYDROMORPHONE HCL 1 MG/ML IJ SOLN
INTRAMUSCULAR | Status: AC
  Filled 2016-04-07: qty 1

## 2016-04-07 MED ORDER — ONDANSETRON HCL 4 MG/2ML IJ SOLN
INTRAMUSCULAR | Status: DC | PRN
  Administered 2016-04-07: 4 mg via INTRAVENOUS

## 2016-04-07 MED ORDER — FENTANYL CITRATE (PF) 100 MCG/2ML IJ SOLN
50.0000 ug | INTRAMUSCULAR | Status: AC | PRN
Start: 2016-04-07 — End: 2016-04-07
  Administered 2016-04-07: 100 ug via INTRAVENOUS
  Administered 2016-04-07 (×2): 50 ug via INTRAVENOUS

## 2016-04-07 MED ORDER — MIDAZOLAM HCL 2 MG/2ML IJ SOLN
INTRAMUSCULAR | Status: AC
  Filled 2016-04-07: qty 2

## 2016-04-07 MED ORDER — MEPERIDINE HCL 25 MG/ML IJ SOLN: 6.2500 mg | INTRAMUSCULAR | Status: DC | PRN

## 2016-04-07 MED ORDER — LACTATED RINGERS IV SOLN
INTRAVENOUS | Status: DC
  Administered 2016-04-07: 10:00:00 via INTRAVENOUS

## 2016-04-07 MED ORDER — DEXTROSE 5 % IV SOLN
INTRAVENOUS | Status: DC | PRN
  Administered 2016-04-07: 3 g via INTRAVENOUS

## 2016-04-07 MED ORDER — PROPOFOL 500 MG/50ML IV EMUL
INTRAVENOUS | Status: AC
Start: 2016-04-07 — End: 2016-04-07
  Filled 2016-04-07: qty 50

## 2016-04-07 MED ORDER — LIDOCAINE 2% (20 MG/ML) 5 ML SYRINGE
INTRAMUSCULAR | Status: AC
  Filled 2016-04-07: qty 5

## 2016-04-07 MED ORDER — MIDAZOLAM HCL 2 MG/2ML IJ SOLN
1.0000 mg | INTRAMUSCULAR | Status: DC | PRN
  Administered 2016-04-07: 2 mg via INTRAVENOUS

## 2016-04-07 MED ORDER — OXYCODONE HCL 5 MG PO TABS
ORAL_TABLET | ORAL | Status: AC
  Filled 2016-04-07: qty 1

## 2016-04-07 MED ORDER — OXYCODONE HCL 5 MG PO TABS
5.0000 mg | ORAL_TABLET | Freq: Once | ORAL | Status: AC | PRN
  Administered 2016-04-07: 5 mg via ORAL

## 2016-04-07 MED ORDER — CEFAZOLIN IN D5W 1 GM/50ML IV SOLN
INTRAVENOUS | Status: AC
  Filled 2016-04-07: qty 50

## 2016-04-07 MED ORDER — BUPIVACAINE HCL (PF) 0.25 % IJ SOLN
INTRAMUSCULAR | Status: AC
  Filled 2016-04-07: qty 30

## 2016-04-07 MED ORDER — CEFAZOLIN SODIUM-DEXTROSE 2-4 GM/100ML-% IV SOLN
INTRAVENOUS | Status: AC
  Filled 2016-04-07: qty 100

## 2016-04-07 MED ORDER — ONDANSETRON HCL 4 MG/2ML IJ SOLN
INTRAMUSCULAR | Status: AC
Start: 2016-04-07 — End: 2016-04-07
  Filled 2016-04-07: qty 2

## 2016-04-07 MED ORDER — BUPIVACAINE HCL (PF) 0.5 % IJ SOLN
INTRAMUSCULAR | Status: DC | PRN
  Administered 2016-04-07: 20 mL

## 2016-04-07 MED ORDER — PROPOFOL 10 MG/ML IV BOLUS
INTRAVENOUS | Status: DC | PRN
  Administered 2016-04-07: 200 mg via INTRAVENOUS

## 2016-04-07 MED ORDER — LIDOCAINE HCL (CARDIAC) 20 MG/ML IV SOLN
INTRAVENOUS | Status: DC | PRN
  Administered 2016-04-07: 50 mg via INTRAVENOUS

## 2016-04-07 MED ORDER — SENNA-DOCUSATE SODIUM 8.6-50 MG PO TABS: 2.0000 | ORAL_TABLET | Freq: Every day | ORAL | 1 refills | Status: AC

## 2016-04-07 SURGICAL SUPPLY — 37 items
BANDAGE ACE 6X5 VEL STRL LF (GAUZE/BANDAGES/DRESSINGS) ×2 IMPLANT
BLADE CUTTER GATOR 3.5 (BLADE) ×2 IMPLANT
CLSR STERI-STRIP ANTIMIC 1/2X4 (GAUZE/BANDAGES/DRESSINGS) ×2 IMPLANT
CUTTER KNOT PUSHER 2-0 FIBERWI (INSTRUMENTS) IMPLANT
CUTTER MENISCUS  4.2MM (BLADE)
CUTTER MENISCUS 4.2MM (BLADE) IMPLANT
DRAPE ARTHROSCOPY W/POUCH 90 (DRAPES) ×2 IMPLANT
DRAPE IMP U-DRAPE 54X76 (DRAPES) ×2 IMPLANT
DURAPREP 26ML APPLICATOR (WOUND CARE) ×2 IMPLANT
ELECT MENISCUS 165MM 90D (ELECTRODE) IMPLANT
ELECT REM PT RETURN 9FT ADLT (ELECTROSURGICAL)
ELECTRODE REM PT RTRN 9FT ADLT (ELECTROSURGICAL) IMPLANT
GAUZE SPONGE 4X4 12PLY STRL (GAUZE/BANDAGES/DRESSINGS) ×2 IMPLANT
GLOVE BIO SURGEON STRL SZ8 (GLOVE) ×2 IMPLANT
GLOVE BIOGEL M STRL SZ7.5 (GLOVE) ×2 IMPLANT
GLOVE BIOGEL PI IND STRL 8 (GLOVE) ×3 IMPLANT
GLOVE BIOGEL PI INDICATOR 8 (GLOVE) ×3
GLOVE ECLIPSE 6.5 STRL STRAW (GLOVE) ×2 IMPLANT
GLOVE ORTHO TXT STRL SZ7.5 (GLOVE) ×2 IMPLANT
GOWN STRL REUS W/ TWL LRG LVL3 (GOWN DISPOSABLE) ×2 IMPLANT
GOWN STRL REUS W/ TWL XL LVL3 (GOWN DISPOSABLE) ×3 IMPLANT
GOWN STRL REUS W/TWL LRG LVL3 (GOWN DISPOSABLE) ×2
GOWN STRL REUS W/TWL XL LVL3 (GOWN DISPOSABLE) ×3
IV NS IRRIG 3000ML ARTHROMATIC (IV SOLUTION) ×4 IMPLANT
KNEE WRAP E Z 3 GEL PACK (MISCELLANEOUS) ×2 IMPLANT
MANIFOLD NEPTUNE II (INSTRUMENTS) ×2 IMPLANT
PACK ARTHROSCOPY DSU (CUSTOM PROCEDURE TRAY) ×2 IMPLANT
PACK BASIN DAY SURGERY FS (CUSTOM PROCEDURE TRAY) ×2 IMPLANT
PENCIL BUTTON HOLSTER BLD 10FT (ELECTRODE) IMPLANT
PROBE BIPOLAR ATHRO 135MM 90D (MISCELLANEOUS) IMPLANT
SET ARTHROSCOPY TUBING (MISCELLANEOUS) ×1
SET ARTHROSCOPY TUBING LN (MISCELLANEOUS) ×1 IMPLANT
SLEEVE SCD COMPRESS KNEE MED (MISCELLANEOUS) IMPLANT
SUT MNCRL AB 4-0 PS2 18 (SUTURE) ×2 IMPLANT
TOWEL OR 17X24 6PK STRL BLUE (TOWEL DISPOSABLE) ×2 IMPLANT
TOWEL OR NON WOVEN STRL DISP B (DISPOSABLE) ×2 IMPLANT
WATER STERILE IRR 1000ML POUR (IV SOLUTION) ×2 IMPLANT

## 2016-04-07 NOTE — Transfer of Care (Signed)
Immediate Anesthesia Transfer of Care Note  Patient: [name]  Procedure(s) Performed: Procedure(s): RIGHT KNEE ARTHROSCOPY WITH LATERAL MENISECTOMY (Right)  Patient Location: PACU  Anesthesia Type:General  Level of Consciousness: awake and alert   Airway & Oxygen Therapy: Patient Spontanous Breathing and Patient connected to face mask oxygen  Post-op Assessment: Report given to RN and Post -op Vital signs reviewed and stable  Post vital signs: Reviewed and stable  Last Vitals:  Vitals:   04/07/16 0948  BP: 128/89  Pulse: 96  Resp: 18  Temp: 37 C    Last Pain:  Vitals:   04/07/16 0948  TempSrc: Oral  PainSc: 6       Patients Stated Pain Goal: 4 (61/44/31 5400)  Complications: No apparent anesthesia complications

## 2016-04-07 NOTE — Op Note (Signed)
04/07/2016  11:56 AM  PATIENT:  Valerie Oconnor    PRE-OPERATIVE DIAGNOSIS:  RIGHT KNEE TEAR OF LATERAL MENISCUS   POST-OPERATIVE DIAGNOSIS:  Same  PROCEDURE:  RIGHT KNEE ARTHROSCOPY WITH LATERAL MENISECTOMY  SURGEON:  Johnny Bridge, MD  PHYSICIAN ASSISTANT: Joya Gaskins, OPA-C, present and scrubbed throughout the case, critical for completion in a timely fashion, and for retraction, instrumentation, and closure.  Second assistant: Morley Kos, orthopedic PA-C  ANESTHESIA:   General  PREOPERATIVE INDICATIONS:  Valerie Oconnor is a  36 y.o. female with a diagnosis of [place]  who failed conservative measures and elected for surgical management.  She's had 2 previous knee arthroscopies for this lateral meniscus done many years ago. I discussed the potential that we may not be able to improve her symptoms.  The risks benefits and alternatives were discussed with the patient preoperatively including but not limited to the risks of infection, bleeding, nerve injury, cardiopulmonary complications, the need for revision surgery, among others, and the patient was willing to proceed.  ESTIMATED BLOOD LOSS: Minimal  OPERATIVE IMPLANTS: None  OPERATIVE FINDINGS: Complex anterior horn lateral meniscus tear, there was a tear in the mid body although it was not as impressive as I had hoped. The articular surface of the patellofemoral joint was normal, the medial femoral condyle actually had a small amount of cracking, with some delamination at the lateralmost aspect of the medial femoral condyle. The anterior cruciate ligament and PCL were intact.  OPERATIVE PROCEDURE: The patient was brought to the operating room and placed in supine position. Gen. anesthesia was administered. IV antibiotics was given. The right lower extremity was prepped and draped in usual sterile fashion. Time out performed. Diagnostic arthroscopy carried out the above-named findings. I  used the arthroscopic shaver to debride the lateral meniscus at the mid body, and there was somewhat of a cleavage type tear. I did get a small amount of cystic fluid expressed. I debrided this both with the basket, and I also used the left-sided basket, and the shaver until I reached a stable configuration. I switched portals as well in order to optimize debridement and visualization.  I debated for a reasonably long time about what to do about the cleavage tear in the anterior meniscus. It almost looked like there was a thinning of the capsule around the sub-meniscal area which could've been a previous sub-meniscal arthrotomy, or just a thinning. I could not she get the probe through it however, and although the lateral meniscus was fairly hypermobile, I think that was probably her normal anatomy. She may have had some previous anterior debridement from her other knee arthroscopies. I did use a basket to debride the tear of the anterior horn of the lateral meniscus, I attempted to do so to maintain some degree of integrity of the fibers of the attachment at the base of the anterior cruciate ligament.  I debated whether or not to attempt a repair of the anterior horn, however I did not feel confident that this is going to have good vascularity, nor was it a typical configuration that would be amenable to a repair, and so I continued with the debridement until I reached stable configuration of the tissue.  I confirmed from multiple portals, the instruments were removed, portals closed with Monocryl followed by Steri-Strips and sterile gauze. She was awakened and returned to the PACU in stable and satisfactory condition. There were no complications.

## 2016-04-07 NOTE — H&P (Signed)
PREOPERATIVE H&P  Chief Complaint: RIGHT KNEE TEAR OF LATERAL MENISCUS   HPI: Valerie Oconnor is a 36 y.o. female who presents for preoperative history and physical with a diagnosis of RIGHT KNEE TEAR OF LATERAL MENISCUS . Symptoms are rated as moderate to severe, and have been worsening.  This is significantly impairing activities of daily living.  She has elected for surgical management. She's had 2 previous knee arthroscopies, done by Dr. Noemi Chapel. Pain is rated 8/10. It's been bothering her for more than 3 months but worse in the last month. She's failed injections and anti-inflammatories and tramadol.   Past Medical History:  Diagnosis Date  . Chronic low back pain   . History of kidney stones   . Lateral meniscus tear 03/2016   right  . Migraines    Past Surgical History:  Procedure Laterality Date  . ADENOIDECTOMY  1990  . EXPLORATORY LAPAROTOMY  11/07/2008  . EXTRACORPOREAL SHOCK WAVE LITHOTRIPSY  2010  . INGUINAL HERNIA REPAIR Bilateral 1985  . INGUINAL HERNIA REPAIR Bilateral 1985  . KNEE ARTHROSCOPY Right    x 2  . KNEE ARTHROSCOPY Left   . LAPAROSCOPIC GASTRIC BANDING  2007  . LAPAROTOMY Right 07/22/2013   Procedure: LAPAROTOMY, REMOVAL OF RIGHT OVARIAN MASS FROZEN SECTION, peritoneal washings;  Surgeon: Cyril Mourning, MD;  Location: [facility];  Service: Gynecology;  Laterality: Right;  . LYSIS OF ADHESION  11/07/2008  . TONSILLECTOMY  1991  . TYMPANOSTOMY TUBE PLACEMENT Bilateral    x 6  . VENTRAL HERNIA REPAIR  03/02/2009   Social History   Social History  . Marital status: Single    Spouse name: N/A  . Number of children: N/A  . Years of education: N/A   Social History Main Topics  . Smoking status: Former Smoker    Quit date: 01/24/2015  . Smokeless tobacco: Never Used  . Alcohol use No  . Drug use: No  . Sexual activity: Not Asked   Other Topics Concern  . None   Social History Narrative  . None   History reviewed. No  pertinent family history. Allergies  Allergen Reactions  . Ceclor [Cefaclor] Hives   Prior to Admission medications   Medication Sig Start Date End Date Taking? Authorizing Provider  ALPRAZolam Duanne Moron) 0.5 MG tablet Take 1 tablet (0.5 mg total) by mouth 3 (three) times daily as needed for anxiety (or muscle twitches). 10/15/15  Yes Janece Canterbury, MD  ibuprofen (ADVIL,MOTRIN) 600 MG tablet Take 600 mg by mouth every 6 (six) hours as needed.   Yes Historical Provider, MD  Norethin-Eth Estradiol-Fe [facility] FE,WYMZYA Orrin Brigham) 0.4-35 MG-MCG tablet Chew 1 tablet by mouth every evening.   Yes Historical Provider, MD  traMADol (ULTRAM) 50 MG tablet Take by mouth 2 (two) times daily.   Yes Historical Provider, MD  zolpidem (AMBIEN) 10 MG tablet Take 10 mg by mouth at bedtime as needed for sleep.   Yes Historical Provider, MD     Positive ROS: All other systems have been reviewed and were otherwise negative with the exception of those mentioned in the HPI and as above.  Physical Exam: General: Alert, no acute distress Cardiovascular: No pedal edema Respiratory: No cyanosis, no use of accessory musculature GI: No organomegaly, abdomen is soft and non-tender Skin: No lesions in the area of chief complaint Neurologic: Sensation intact distally Psychiatric: Patient is competent for consent with normal mood and affect Lymphatic: No axillary or cervical lymphadenopathy  MUSCULOSKELETAL: Right knee  has well-healed surgical wounds from previous surgeries. Range of motion 0-110. Intact stability to varus and valgus stress. Positive lateral joint line tenderness.   MRI demonstrates evidence for a complex lateral meniscus tear.  Assessment: RIGHT KNEE TEAR OF LATERAL MENISCUS    Plan: Plan for Procedure(s): KNEE ARTHROSCOPY WITH LATERAL MENISCECTOMY  The risks benefits and alternatives were discussed with the patient including but not limited to the risks of nonoperative treatment,  versus surgical intervention including infection, bleeding, nerve injury,  blood clots, cardiopulmonary complications, morbidity, mortality, among others, and they were willing to proceed.   Johnny Bridge, MD Cell [phone]   04/07/2016 9:41 AM

## 2016-04-07 NOTE — Anesthesia Postprocedure Evaluation (Signed)
Anesthesia Post Note  Patient: Valerie Oconnor  Procedure(s) Performed: Procedure(s) (LRB): RIGHT KNEE ARTHROSCOPY WITH LATERAL MENISECTOMY (Right)  Patient location during evaluation: PACU Anesthesia Type: General Level of consciousness: awake and alert Pain management: pain level controlled Vital Signs Assessment: post-procedure vital signs reviewed and stable Respiratory status: spontaneous breathing, nonlabored ventilation and respiratory function stable Cardiovascular status: blood pressure returned to baseline and stable Postop Assessment: no signs of nausea or vomiting Anesthetic complications: no       Last Vitals:  Vitals:   04/07/16 1234 04/07/16 1244  BP:  (!) 151/94  Pulse: 86 85  Resp: 14 17  Temp:  36.9 C    Last Pain:  Vitals:   04/07/16 1253  TempSrc:   PainSc: [place]

## 2016-04-07 NOTE — Anesthesia Preprocedure Evaluation (Signed)
Anesthesia Evaluation  Patient identified by MRN, date of birth, ID band Patient awake    Reviewed: Allergy & Precautions, H&P , NPO status , Patient's Chart, lab work & pertinent test results  Airway Mallampati: II  TM Distance: >3 FB Neck ROM: Full    Dental  (+) Teeth Intact, Dental Advisory Given   Pulmonary neg pulmonary ROS, Current Smoker, former smoker,    breath sounds clear to auscultation       Cardiovascular Exercise Tolerance: Good negative cardio ROS   Rhythm:Regular Rate:Normal     Neuro/Psych  Headaches, negative neurological ROS  negative psych ROS   GI/Hepatic negative GI ROS, Neg liver ROS, Prior gastric banding   Endo/Other  negative endocrine ROSMorbid obesity  Renal/GU negative Renal ROS  negative genitourinary   Musculoskeletal negative musculoskeletal ROS (+)   Abdominal (+) + obese,   Peds  Hematology negative hematology ROS (+)   Anesthesia Other Findings Lap. Band in the past  Reproductive/Obstetrics negative OB ROS                             Anesthesia Physical  Anesthesia Plan  ASA: II  Anesthesia Plan: General   Post-op Pain Management:    Induction: Intravenous  Airway Management Planned: Oral ETT  Additional Equipment:   Intra-op Plan:   Post-operative Plan: Extubation in OR  Informed Consent: I have reviewed the patients History and Physical, chart, labs and discussed the procedure including the risks, benefits and alternatives for the proposed anesthesia with the patient or authorized representative who has indicated his/her understanding and acceptance.   Dental advisory given  Plan Discussed with: CRNA  Anesthesia Plan Comments:         Anesthesia Quick Evaluation

## 2016-04-07 NOTE — Anesthesia Procedure Notes (Signed)
Procedure Name: LMA Insertion Performed by: Kama Cammarano W Pre-anesthesia Checklist: Patient identified, Emergency Drugs available, Suction available and Patient being monitored Patient Re-evaluated:Patient Re-evaluated prior to inductionOxygen Delivery Method: Circle system utilized Preoxygenation: Pre-oxygenation with 100% oxygen Intubation Type: IV induction Ventilation: Mask ventilation without difficulty LMA: LMA inserted LMA Size: 4.0 Number of attempts: 1 Placement Confirmation: positive ETCO2 Tube secured with: Tape Dental Injury: Teeth and Oropharynx as per pre-operative assessment        

## 2016-04-07 NOTE — Discharge Instructions (Signed)
Diet: As you were doing prior to hospitalization   Shower:  May shower but keep the wounds dry, use an occlusive plastic wrap, NO SOAKING IN TUB.  If the bandage gets wet, change with a clean dry gauze.  If you have a splint on, leave the splint in place and keep the splint dry with a plastic bag.  Dressing:  You may change your dressing 3-5 days after surgery, unless you have a splint.  If you have a splint, then just leave the splint in place and we will change your bandages during your first follow-up appointment.    If you had hand or foot surgery, we will plan to remove your stitches in about 2 weeks in the office.  For all other surgeries, there are sticky tapes (steri-strips) on your wounds and all the stitches are absorbable.  Leave the steri-strips in place when changing your dressings, they will peel off with time, usually 2-3 weeks.  Activity:  Increase activity slowly as tolerated, but follow the weight bearing instructions below.  The rules on driving is that you can not be taking narcotics while you drive, and you must feel in control of the vehicle.    Weight Bearing:   As tolerated.        Post Anesthesia Home Care Instructions  Activity: Get plenty of rest for the remainder of the day. A responsible adult should stay with you for 24 hours following the procedure.  For the next 24 hours, DO NOT: -Drive a car -Paediatric nurse -Drink alcoholic beverages -Take any medication unless instructed by your physician -Make any legal decisions or sign important papers.  Meals: Start with liquid foods such as gelatin or soup. Progress to regular foods as tolerated. Avoid greasy, spicy, heavy foods. If nausea and/or vomiting occur, drink only clear liquids until the nausea and/or vomiting subsides. Call your physician if vomiting continues.  Special Instructions/Symptoms: Your throat may feel dry or sore from the anesthesia or the breathing tube placed in your throat during  surgery. If this causes discomfort, gargle with warm salt water. The discomfort should disappear within 24 hours.  If you had a scopolamine patch placed behind your ear for the management of post- operative nausea and/or vomiting:  1. The medication in the patch is effective for 72 hours, after which it should be removed.  Wrap patch in a tissue and discard in the trash. Wash hands thoroughly with soap and water. 2. You may remove the patch earlier than 72 hours if you experience unpleasant side effects which may include dry mouth, dizziness or visual disturbances. 3. Avoid touching the patch. Wash your hands with soap and water after contact with the patch.     To prevent constipation: you may use a stool softener such as -  Colace (over the counter) 100 mg by mouth twice a day  Drink plenty of fluids (prune juice may be helpful) and high fiber foods Miralax (over the counter) for constipation as needed.    Itching:  If you experience itching with your medications, try taking only a single pain pill, or even half a pain pill at a time.  You may take up to 10 pain pills per day, and you can also use benadryl over the counter for itching or also to help with sleep.   Precautions:  If you experience chest pain or shortness of breath - call 911 immediately for transfer to the hospital emergency department!!  If you develop a fever greater  that 101 F, purulent drainage from wound, increased redness or drainage from wound, or calf pain -- Call the office at [phone]                                                Follow- Up Appointment:  Please call for an appointment to be seen in 2 weeks [place] - [phone]

## 2016-04-08 ENCOUNTER — Encounter (HOSPITAL_BASED_OUTPATIENT_CLINIC_OR_DEPARTMENT_OTHER): Payer: Self-pay | Admitting: Orthopedic Surgery

## 2016-04-08 NOTE — Addendum Note (Signed)
Addendum  created 04/08/16 1300 by Lyndee Leo, CRNA   Charge Capture section accepted, Visit diagnoses modified

## 2016-05-26 DIAGNOSIS — T300 Burn of unspecified body region, unspecified degree: Secondary | ICD-10-CM | POA: Diagnosis not present

## 2016-05-26 DIAGNOSIS — M549 Dorsalgia, unspecified: Secondary | ICD-10-CM | POA: Diagnosis not present

## 2016-06-24 DIAGNOSIS — M25562 Pain in left knee: Secondary | ICD-10-CM | POA: Diagnosis not present

## 2016-06-24 DIAGNOSIS — S83281D Other tear of lateral meniscus, current injury, right knee, subsequent encounter: Secondary | ICD-10-CM | POA: Diagnosis not present

## 2016-09-13 DIAGNOSIS — M7989 Other specified soft tissue disorders: Secondary | ICD-10-CM | POA: Diagnosis not present

## 2016-09-14 DIAGNOSIS — R609 Edema, unspecified: Secondary | ICD-10-CM | POA: Diagnosis not present

## 2016-10-26 DIAGNOSIS — Z01419 Encounter for gynecological examination (general) (routine) without abnormal findings: Secondary | ICD-10-CM | POA: Diagnosis not present

## 2017-01-11 DIAGNOSIS — Z9884 Bariatric surgery status: Secondary | ICD-10-CM | POA: Diagnosis not present

## 2017-01-11 DIAGNOSIS — R1110 Vomiting, unspecified: Secondary | ICD-10-CM | POA: Diagnosis not present

## 2017-02-28 DIAGNOSIS — R062 Wheezing: Secondary | ICD-10-CM | POA: Diagnosis not present

## 2017-02-28 DIAGNOSIS — J45909 Unspecified asthma, uncomplicated: Secondary | ICD-10-CM | POA: Diagnosis not present

## 2017-04-14 DIAGNOSIS — M25562 Pain in left knee: Secondary | ICD-10-CM | POA: Diagnosis not present

## 2017-04-14 DIAGNOSIS — M25561 Pain in right knee: Secondary | ICD-10-CM | POA: Diagnosis not present

## 2017-09-13 DIAGNOSIS — N3000 Acute cystitis without hematuria: Secondary | ICD-10-CM | POA: Diagnosis not present

## 2017-09-13 DIAGNOSIS — Z9884 Bariatric surgery status: Secondary | ICD-10-CM | POA: Diagnosis not present

## 2017-10-11 DIAGNOSIS — M25562 Pain in left knee: Secondary | ICD-10-CM | POA: Diagnosis not present

## 2017-10-11 DIAGNOSIS — M25561 Pain in right knee: Secondary | ICD-10-CM | POA: Diagnosis not present

## 2017-11-09 DIAGNOSIS — Z6841 Body Mass Index (BMI) 40.0 and over, adult: Secondary | ICD-10-CM | POA: Diagnosis not present

## 2017-11-09 DIAGNOSIS — Z01419 Encounter for gynecological examination (general) (routine) without abnormal findings: Secondary | ICD-10-CM | POA: Diagnosis not present

## 2018-01-20 DIAGNOSIS — H109 Unspecified conjunctivitis: Secondary | ICD-10-CM | POA: Diagnosis not present

## 2018-01-20 DIAGNOSIS — N911 Secondary amenorrhea: Secondary | ICD-10-CM | POA: Diagnosis not present

## 2018-02-09 DIAGNOSIS — H16293 Other keratoconjunctivitis, bilateral: Secondary | ICD-10-CM | POA: Diagnosis not present

## 2018-07-06 IMAGING — MR MR KNEE*R* W/O CM
4 of 6 series · 22 of 40 positions shown · non-contrast
Comparison: MRI right knee 11/07/2004.

CLINICAL DATA: Pain and weakness of the right knee due to an injury
from a fall 10 days ago. History of prior surgery x2, last in 4770.

EXAM:
MRI OF THE RIGHT KNEE WITHOUT CONTRAST
TECHNIQUE: Multiplanar, multisequence MR imaging of the knee was performed. No
intravenous contrast was administered.

[Series 4: PD fat-sat · axial · 4.0mm · 0.42mm/px · z∈[-110,+10]mm · 6 of 26 slices shown (1 of 3)]
[im 1/26]
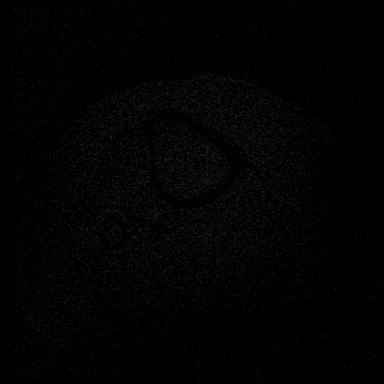
[im 6/26]
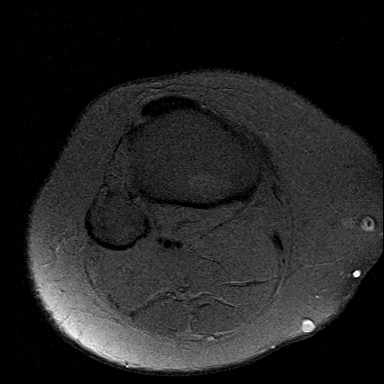
[im 11/26]
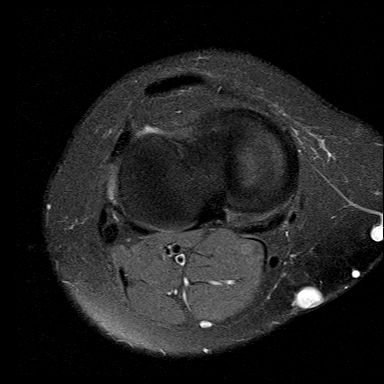
[im 16/26]
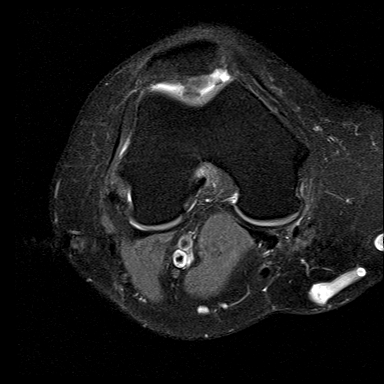
[im 21/26]
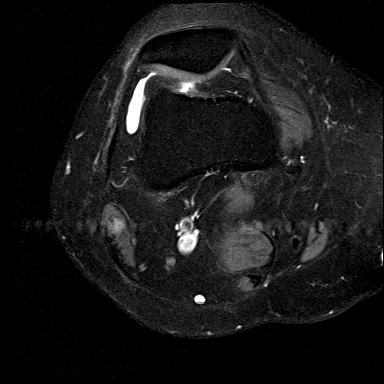
[im 26/26]
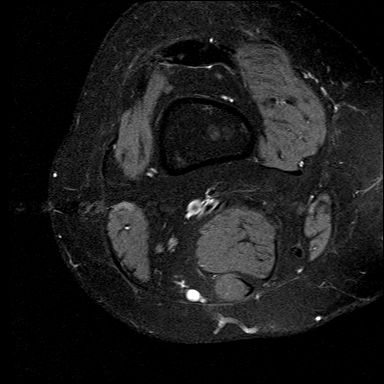

[Series 6: T2 fat-sat · coronal · 3.0mm · 0.29mm/px · 3 of 27 slices shown]
[im 4/27]
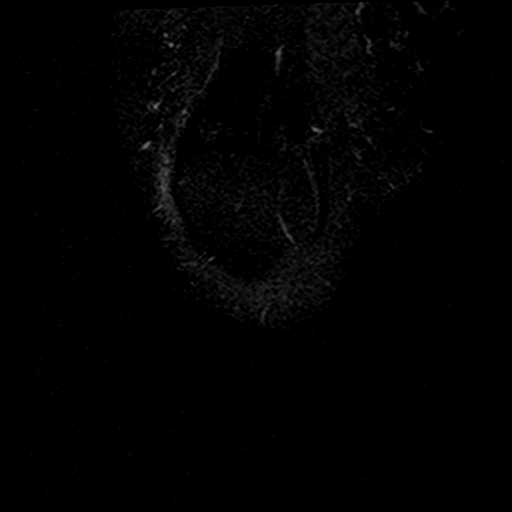
[im 15/27]
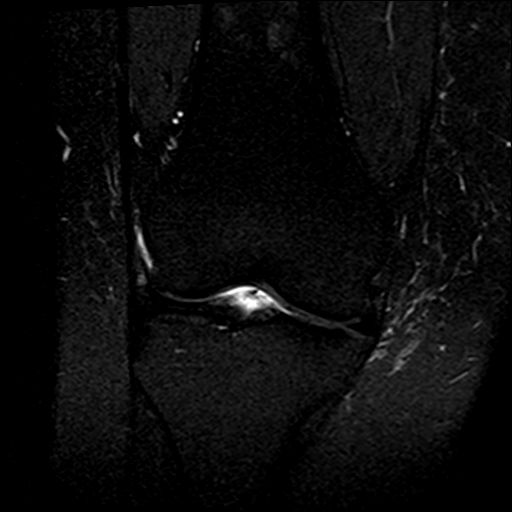
[im 23/27]
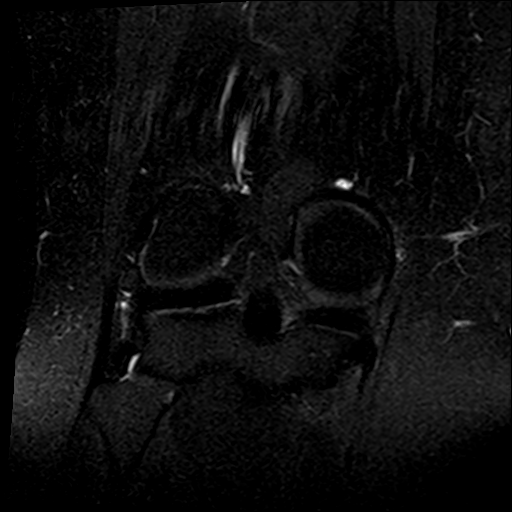

[Series 7: PD fat-sat · coronal · 3.0mm · 0.29mm/px · 8 of 27 slices shown (2 of 3)]
[im 1/27]
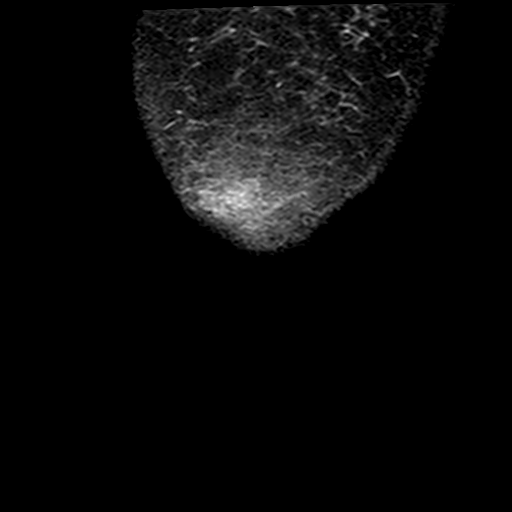
[im 4/27]
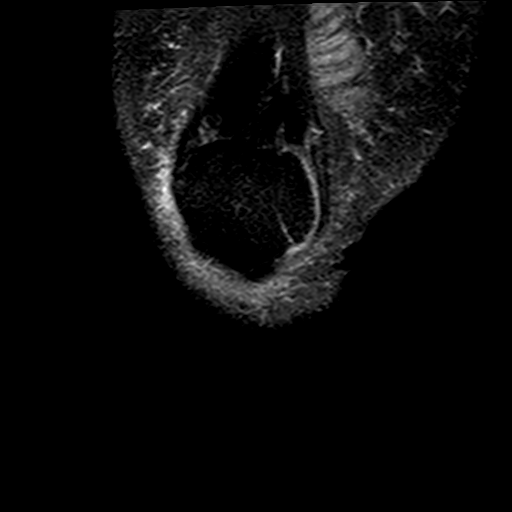
[im 8/27]
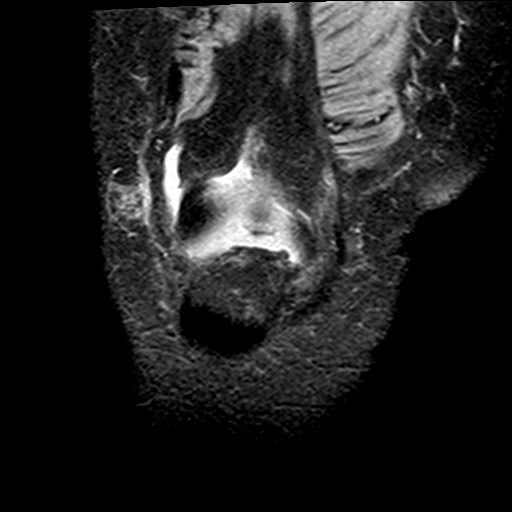
[im 12/27]
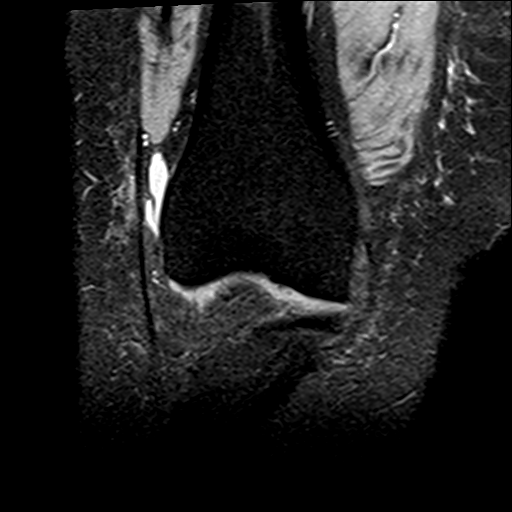
[im 15/27]
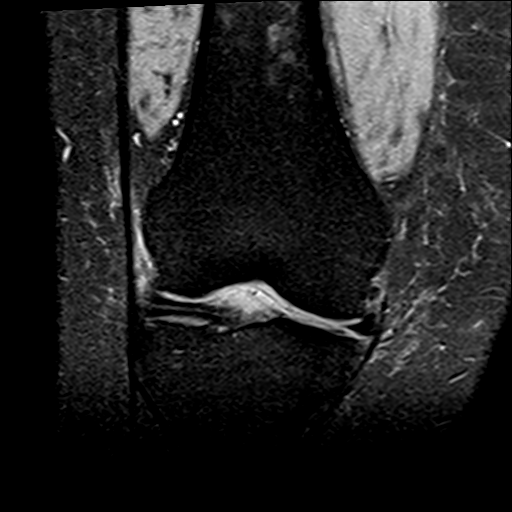
[im 19/27]
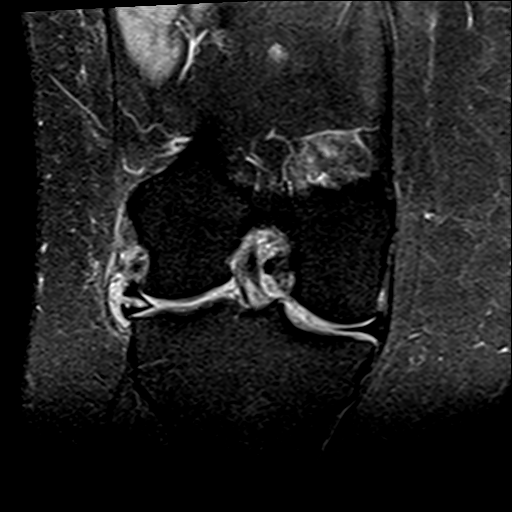
[im 23/27]
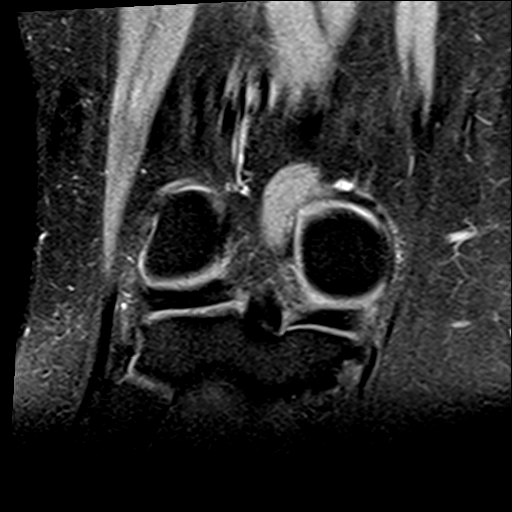
[im 27/27]
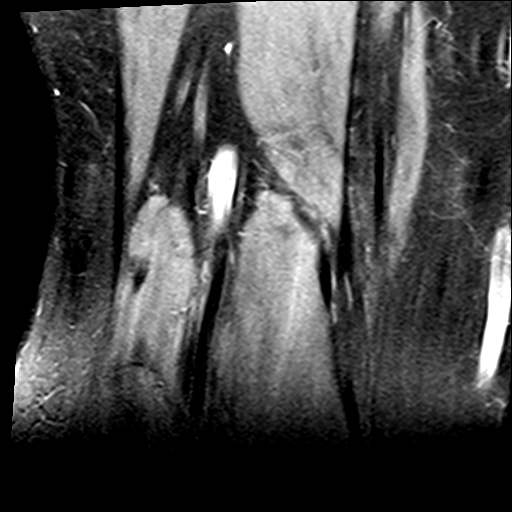

[Series 8: PD fat-sat · sagittal · 4.0mm · 0.31mm/px · 5 of 22 slices shown (3 of 3)]
[im 1/22]
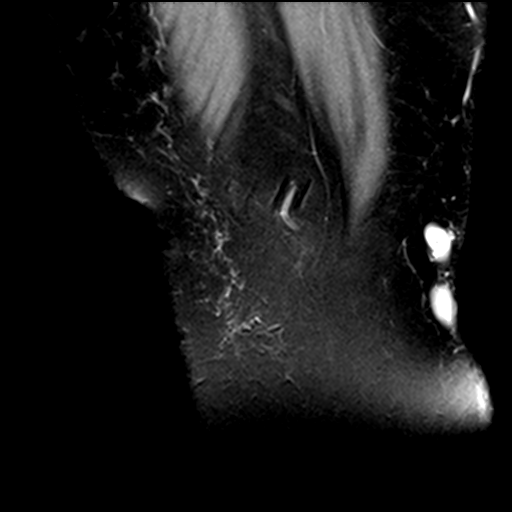
[im 5/22]
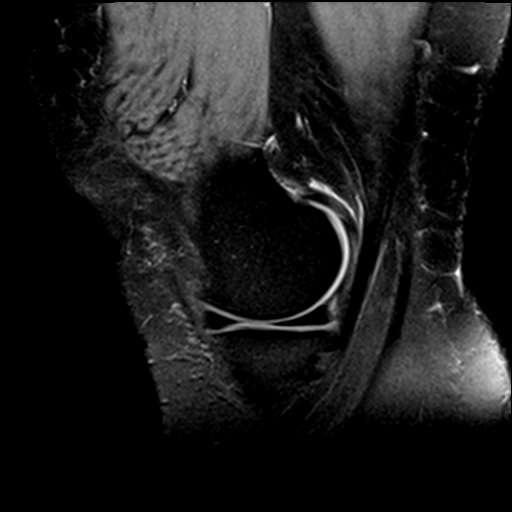
[im 9/22]
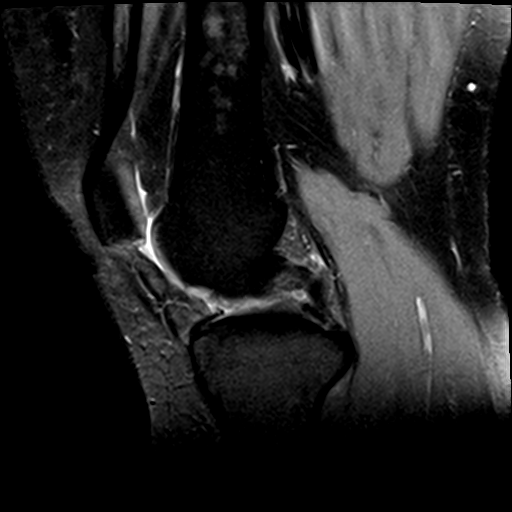
[im 13/22]
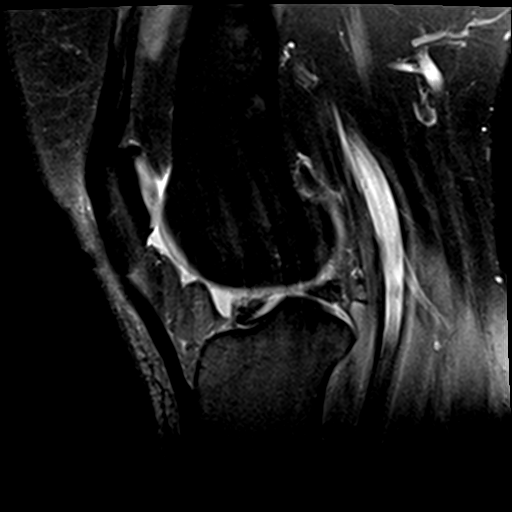
[im 22/22]
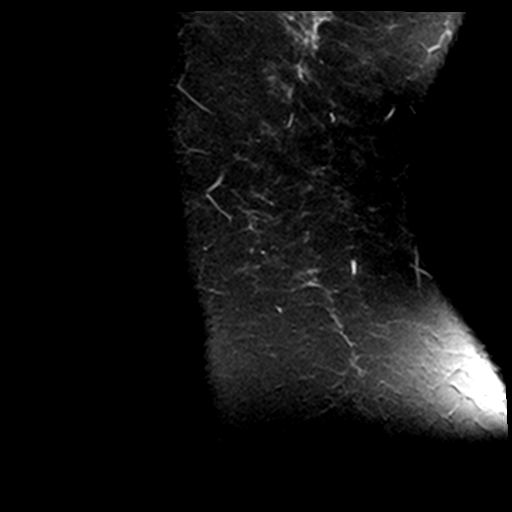

[22 of 40 positions shown; findings below may reference images not displayed]

FINDINGS: MENISCI

Medial meniscus:  Intact.

Lateral meniscus: A longitudinal tear is seen throughout the
periphery of the anterior horn. The patient also has a very large
tear of the capsular surface of the meniscus in the midbody with an
associated parameniscal cyst which measures 1.5 cm craniocaudal by
0.4 cm transverse by 1.6 cm AP.

LIGAMENTS

Cruciates:  Intact.

Collaterals:  Intact.

CARTILAGE

Patellofemoral:  Negative.

Medial:  Minimally degenerated.

Lateral:  Minimally degenerated.

Joint:  Small effusion.

Popliteal Fossa:  No Baker's cyst.

Extensor Mechanism:  Intact.

Bones:  Normal marrow signal throughout.

Other: Subcutaneous nodule seen on the prior MRI is not included on
this examination.
IMPRESSION: Longitudinal tear throughout the periphery of the anterior horn of
the lateral meniscus. Also seen is a large tear of the capsular
surface of the lateral meniscus with an associated small
parameniscal cyst.

## 2019-09-03 ENCOUNTER — Encounter (HOSPITAL_BASED_OUTPATIENT_CLINIC_OR_DEPARTMENT_OTHER): Payer: Self-pay | Admitting: *Deleted

## 2019-09-03 ENCOUNTER — Emergency Department (HOSPITAL_BASED_OUTPATIENT_CLINIC_OR_DEPARTMENT_OTHER)
Admission: EM | Admit: 2019-09-03 | Discharge: 2019-09-04 | Disposition: A | Discharge status: Home / Self Care | Payer: 59 | Attending: Emergency Medicine | Admitting: Emergency Medicine

## 2019-09-03 ENCOUNTER — Emergency Department (HOSPITAL_BASED_OUTPATIENT_CLINIC_OR_DEPARTMENT_OTHER): Payer: 59

## 2019-09-03 ENCOUNTER — Other Ambulatory Visit: Payer: Self-pay

## 2019-09-03 DIAGNOSIS — R0602 Shortness of breath: Secondary | ICD-10-CM | POA: Diagnosis present

## 2019-09-03 DIAGNOSIS — Z87891 Personal history of nicotine dependence: Secondary | ICD-10-CM | POA: Diagnosis not present

## 2019-09-03 DIAGNOSIS — R072 Precordial pain: Secondary | ICD-10-CM | POA: Diagnosis not present

## 2019-09-03 DIAGNOSIS — R0789 Other chest pain: Secondary | ICD-10-CM

## 2019-09-03 LAB — BASIC METABOLIC PANEL
Anion gap: 11 (ref 5–15)
BUN: 9 mg/dL (ref 6–20)
CO2: 28 mmol/L (ref 22–32)
Calcium: 9.3 mg/dL (ref 8.9–10.3)
Chloride: 99 mmol/L (ref 98–111)
Creatinine, Ser: 0.73 mg/dL (ref 0.44–1.00)
GFR calc Af Amer: >60 mL/min (ref >60)
GFR calc non Af Amer: >60 mL/min (ref >60)
Glucose, Bld: 145 mg/dL — ABNORMAL HIGH (ref 70–99)
Potassium: 3.8 mmol/L (ref 3.5–5.1)
Sodium: 138 mmol/L (ref 135–145)

## 2019-09-03 LAB — CBC
HCT: 40.7 % (ref 36.0–46.0)
Hemoglobin: 13.4 g/dL (ref 12.0–15.0)
MCH: 27.1 pg (ref 26.0–34.0)
MCHC: 32.9 g/dL (ref 30.0–36.0)
MCV: 82.2 fL (ref 80.0–100.0)
Platelets: 334 10*3/uL (ref 150–400)
RBC: 4.95 MIL/uL (ref 3.87–5.11)
RDW: 12 % (ref 11.5–15.5)
WBC: 8.6 10*3/uL (ref 4.0–10.5)
nRBC: 0 % (ref 0.0–0.2)

## 2019-09-03 LAB — PREGNANCY, URINE: Preg Test, Ur: NEGATIVE

## 2019-09-03 LAB — TROPONIN I (HIGH SENSITIVITY): Troponin I (High Sensitivity): 3 ng/L (ref <18)

## 2019-09-03 NOTE — ED Triage Notes (Signed)
Chest pain aching across her chest on and off for 2 days. Same due to anxiety in the past. Sob. She has to use her inhaler when walking up stairs.

## 2019-09-04 LAB — TROPONIN I (HIGH SENSITIVITY): Troponin I (High Sensitivity): 3 ng/L (ref <18)

## 2019-09-04 NOTE — ED Provider Notes (Signed)
[place] EMERGENCY DEPARTMENT Provider Note  CSN: [id number] Arrival date & time: 09/03/19 1954  Chief Complaint(s) Shortness of Breath and Chest Pain  HPI Valerie Oconnor is a 39 y.o. female   The history is provided by the patient.  Chest Pain Pain location:  Substernal area and L chest Pain quality: pressure   Pain radiates to:  L arm Pain severity:  Moderate Duration:  1 week Timing:  Intermittent Progression:  Resolved Chronicity:  New Relieved by:  Nothing Worsened by:  Nothing Associated symptoms: back pain and shortness of breath (only when pain comes)   Associated symptoms: no cough, no fatigue, no lower extremity edema, no nausea and no vomiting    Dislocation shoulder 5-6 months ago.  Past Medical History Past Medical History:  Diagnosis Date  . Chronic low back pain   . Complex tear of lateral meniscus of right knee 04/07/2016  . History of kidney stones   . Lateral meniscus tear 03/2016   right  . Migraines    Patient Active Problem List   Diagnosis Date Noted  . Complex tear of lateral meniscus of right knee 04/07/2016  . Tardive dyskinesia 10/15/2015  . Acute encephalopathy 10/15/2015  . Depression with anxiety   . Adverse reaction to selective serotonin reuptake inhibitor   . Fibroid 07/22/2013   Home Medication(s) Prior to Admission medications   Medication Sig Start Date End Date Taking? Authorizing Provider  ALPRAZolam Duanne Moron) 0.5 MG tablet Take 1 tablet (0.5 mg total) by mouth 3 (three) times daily as needed for anxiety (or muscle twitches). 10/15/15  Yes Short, Noah Delaine, MD  butalbital-aspirin-caffeine [facility]) [phone] MG capsule Take by mouth 2 (two) times daily as needed for headache.   Yes [provider]  traMADol (ULTRAM) 50 MG tablet Take by mouth 2 (two) times daily.   Yes [provider]  zolpidem (AMBIEN) 10 MG tablet Take 5 mg by mouth at bedtime as needed for sleep.    Yes [provider]   Diphenhydramine-APAP, sleep, 38-500 MG PACK Take by mouth.    [provider]  ibuprofen (ADVIL,MOTRIN) 600 MG tablet Take 600 mg by mouth every 6 (six) hours as needed.    [provider]  Norethin-Eth Estradiol-Fe [facility] FE,WYMZYA Orrin Brigham) 0.4-35 MG-MCG tablet Chew 1 tablet by mouth every evening.    [provider]  ondansetron (ZOFRAN) 4 MG tablet Take 1 tablet (4 mg total) by mouth every 8 (eight) hours as needed for nausea or vomiting. 04/07/16   Marchia Bond, MD  oxyCODONE-acetaminophen (ROXICET) 5-325 MG tablet Take 1-2 tablets by mouth every 6 (six) hours as needed for severe pain. 04/07/16   Marchia Bond, MD  sennosides-docusate sodium (SENOKOT-S) 8.6-50 MG tablet Take 2 tablets by mouth daily. 04/07/16   Marchia Bond, MD  Past Surgical History Past Surgical History:  Procedure Laterality Date  . ADENOIDECTOMY  1990  . EXPLORATORY LAPAROTOMY  11/07/2008  . EXTRACORPOREAL SHOCK WAVE LITHOTRIPSY  2010  . INGUINAL HERNIA REPAIR Bilateral 1985  . INGUINAL HERNIA REPAIR Bilateral 1985  . KNEE ARTHROSCOPY Right    x 2  . KNEE ARTHROSCOPY Left   . KNEE ARTHROSCOPY WITH LATERAL MENISECTOMY Right 04/07/2016   Procedure: RIGHT KNEE ARTHROSCOPY WITH LATERAL MENISECTOMY;  Surgeon: Marchia Bond, MD;  Location: [place];  Service: Orthopedics;  Laterality: Right;  . LAPAROSCOPIC GASTRIC BANDING  2007  . LAPAROTOMY Right 07/22/2013   Procedure: LAPAROTOMY, REMOVAL OF RIGHT OVARIAN MASS FROZEN SECTION, peritoneal washings;  Surgeon: Cyril Mourning, MD;  Location: [place];  Service: Gynecology;  Laterality: Right;  . LYSIS OF ADHESION  11/07/2008  . TONSILLECTOMY  1991  . TYMPANOSTOMY TUBE PLACEMENT Bilateral    x 6  . VENTRAL HERNIA REPAIR  03/02/2009   Family History No family  history on file.  Social History Social History   Tobacco Use  . Smoking status: Former Smoker    Quit date: 01/24/2015    Years since quitting: 4.6  . Smokeless tobacco: Never Used  Substance Use Topics  . Alcohol use: No  . Drug use: No   Allergies Ceclor [cefaclor] and Vicodin [hydrocodone-acetaminophen]  Review of Systems Review of Systems  Constitutional: Negative for fatigue.  Respiratory: Positive for shortness of breath (only when pain comes). Negative for cough.   Cardiovascular: Positive for chest pain.  Gastrointestinal: Negative for nausea and vomiting.  Musculoskeletal: Positive for back pain.   All other systems are reviewed and are negative for acute change except as noted in the HPI  Physical Exam Vital Signs  I have reviewed the triage vital signs BP (!) 142/91 (BP Location: Right Arm)   Pulse 78   Temp 98.7 F (37.1 C) (Oral)   Resp 17   Ht 5\' 6"  (1.676 m)   Wt 122 kg   SpO2 99%   BMI 43.42 kg/m   Physical Exam Vitals reviewed.  Constitutional:      General: She is not in acute distress.    Appearance: She is well-developed. She is not diaphoretic.  HENT:     Head: Normocephalic and atraumatic.     Nose: Nose normal.  Eyes:     General: No scleral icterus.       Right eye: No discharge.        Left eye: No discharge.     Conjunctiva/sclera: Conjunctivae normal.     Pupils: Pupils are equal, round, and reactive to light.  Cardiovascular:     Rate and Rhythm: Normal rate and regular rhythm.     Heart sounds: No murmur heard.  No friction rub. No gallop.   Pulmonary:     Effort: Pulmonary effort is normal. No respiratory distress.     Breath sounds: Normal breath sounds. No stridor. No rales.  Chest:     Chest wall: Tenderness present.    Abdominal:     General: There is no distension.     Palpations: Abdomen is soft.     Tenderness: There is no abdominal tenderness.  Musculoskeletal:     Cervical back: Normal range of motion and  neck supple.     Thoracic back: Spasms and tenderness present.       Back:  Skin:    General: Skin is warm and dry.  Findings: No erythema or rash.  Neurological:     Mental Status: She is alert and oriented to person, place, and time.     ED Results and Treatments Labs (all labs ordered are listed, but only abnormal results are displayed) Labs Reviewed  BASIC METABOLIC PANEL - Abnormal; Notable for the following components:      Result Value   Glucose, Bld 145 (*)    All other components within normal limits  CBC  PREGNANCY, URINE  TROPONIN I (HIGH SENSITIVITY)  TROPONIN I (HIGH SENSITIVITY)                                                                                                                         EKG  EKG Interpretation  Date/Time:  Tuesday September 03 2019 20:12:38 EDT Ventricular Rate:  91 PR Interval:  146 QRS Duration: 80 QT Interval:  376 QTC Calculation: 462 R Axis:   90 Text Interpretation: Normal sinus rhythm Rightward axis Cannot rule out Anterior infarct , age undetermined Abnormal ECG No significant change since last tracing Confirmed by Addison Lank [phone]) on 09/03/2019 11:14:51 PM      Radiology DG Chest 2 View  Result Date: 09/03/2019 CLINICAL DATA:  Chest pain, shortness of breath EXAM: CHEST - 2 VIEW COMPARISON:  None. FINDINGS: Heart and mediastinal contours are within normal limits. No focal opacities or effusions. No acute bony abnormality. IMPRESSION: No active cardiopulmonary disease. Electronically Signed   By: Rolm Baptise M.D.   On: 09/03/2019 20:23    Pertinent labs & imaging results that were available during my care of the patient were reviewed by me and considered in my medical decision making (see chart for details).  Medications Ordered in ED Medications - No data to display                                                                                                                                   Procedures Procedures  (including critical care time)  Medical Decision Making / ED Course I have reviewed the nursing notes for this encounter and the patient's prior records (if available in EHR or on provided paperwork).   Valerie Oconnor was evaluated in Emergency Department on 09/04/2019 for the symptoms described in the history of present illness. She was evaluated in the context of the global COVID-19 pandemic, which necessitated consideration that the patient might be at risk  for infection with the SARS-CoV-2 virus that causes COVID-19. Institutional protocols and algorithms that pertain to the evaluation of patients at risk for COVID-19 are in a state of rapid change based on information released by regulatory bodies including the CDC and federal and state organizations. These policies and algorithms were followed during the patient's care in the ED.  Atypical chest pain. EKG without acute ischemic changes or evidence of pericarditis. Heart score of 3. Appropriate for 2 serial troponins which were both negative. Low suspicion for ACS  Presentation not classic for aortic dissection or esophageal perforation.. Low pretest probability for pulmonary embolism, PERC negative.  Chest x-ray without evidence suggestive of pneumonia, pneumothorax, pneumomediastinum.  No abnormal contour of the mediastinum to suggest dissection. No evidence of acute injuries.  Patient has reproducible pain with palpation of the left parascapular musculature. Feel this is likely the cause of her symptoms.        Final Clinical Impression(s) / ED Diagnoses Final diagnoses:  Atypical chest pain    The patient appears reasonably screened and/or stabilized for discharge and I doubt any other medical condition or other [facility] requiring further screening, evaluation, or treatment in the ED at this time prior to discharge. Safe for discharge with strict return precautions.  Disposition:  Discharge  Condition: Good  I have discussed the results, Dx and Tx plan with the patient/family who expressed understanding and agree(s) with the plan. Discharge instructions discussed at length. The patient/family was given strict return precautions who verbalized understanding of the instructions. No further questions at time of discharge.    ED Discharge Orders    None        Follow Up: Maurice Small, MD [address] [place] [address] [phone]  Schedule an appointment as soon as possible for a visit  As needed     This chart was dictated using voice recognition software.  Despite best efforts to proofread,  errors can occur which can change the documentation meaning.   Fatima Blank, MD 09/04/19 [phone]

## 2019-09-04 NOTE — Discharge Instructions (Addendum)
You may use over-the-counter Motrin (Ibuprofen), Acetaminophen (Tylenol), topical muscle creams such as SalonPas, Icy Hot, Bengay, etc. Please stretch, apply heat, and have massage therapy for additional assistance. ° °

## 2020-12-30 DIAGNOSIS — R509 Fever, unspecified: Secondary | ICD-10-CM | POA: Diagnosis not present

## 2020-12-30 DIAGNOSIS — Z20822 Contact with and (suspected) exposure to covid-19: Secondary | ICD-10-CM | POA: Diagnosis not present

## 2020-12-30 DIAGNOSIS — U071 COVID-19: Secondary | ICD-10-CM | POA: Diagnosis not present

## 2021-03-16 DIAGNOSIS — Z03818 Encounter for observation for suspected exposure to other biological agents ruled out: Secondary | ICD-10-CM | POA: Diagnosis not present

## 2021-03-16 DIAGNOSIS — Z20822 Contact with and (suspected) exposure to covid-19: Secondary | ICD-10-CM | POA: Diagnosis not present

## 2021-03-16 DIAGNOSIS — J00 Acute nasopharyngitis [common cold]: Secondary | ICD-10-CM | POA: Diagnosis not present

## 2021-04-05 DIAGNOSIS — H66001 Acute suppurative otitis media without spontaneous rupture of ear drum, right ear: Secondary | ICD-10-CM | POA: Diagnosis not present

## 2021-04-27 DIAGNOSIS — N946 Dysmenorrhea, unspecified: Secondary | ICD-10-CM | POA: Diagnosis not present

## 2021-08-11 DIAGNOSIS — Z6841 Body Mass Index (BMI) 40.0 and over, adult: Secondary | ICD-10-CM | POA: Diagnosis not present

## 2021-08-11 DIAGNOSIS — H66004 Acute suppurative otitis media without spontaneous rupture of ear drum, recurrent, right ear: Secondary | ICD-10-CM | POA: Diagnosis not present

## 2021-08-19 DIAGNOSIS — J45901 Unspecified asthma with (acute) exacerbation: Secondary | ICD-10-CM | POA: Diagnosis not present

## 2021-09-30 DIAGNOSIS — N6320 Unspecified lump in the left breast, unspecified quadrant: Secondary | ICD-10-CM | POA: Diagnosis not present

## 2021-10-01 DIAGNOSIS — N6325 Unspecified lump in the left breast, overlapping quadrants: Secondary | ICD-10-CM | POA: Diagnosis not present

## 2021-10-01 DIAGNOSIS — N6452 Nipple discharge: Secondary | ICD-10-CM | POA: Diagnosis not present

## 2021-10-01 DIAGNOSIS — R922 Inconclusive mammogram: Secondary | ICD-10-CM | POA: Diagnosis not present

## 2021-12-23 DIAGNOSIS — Z6841 Body Mass Index (BMI) 40.0 and over, adult: Secondary | ICD-10-CM | POA: Diagnosis not present

## 2021-12-23 DIAGNOSIS — Z01419 Encounter for gynecological examination (general) (routine) without abnormal findings: Secondary | ICD-10-CM | POA: Diagnosis not present

## 2022-03-08 DIAGNOSIS — H6503 Acute serous otitis media, bilateral: Secondary | ICD-10-CM | POA: Diagnosis not present

## 2022-04-26 DIAGNOSIS — G43009 Migraine without aura, not intractable, without status migrainosus: Secondary | ICD-10-CM | POA: Diagnosis not present

## 2022-05-02 DIAGNOSIS — G43909 Migraine, unspecified, not intractable, without status migrainosus: Secondary | ICD-10-CM | POA: Diagnosis not present

## 2022-05-05 DIAGNOSIS — R519 Headache, unspecified: Secondary | ICD-10-CM | POA: Diagnosis not present

## 2022-05-07 DIAGNOSIS — R519 Headache, unspecified: Secondary | ICD-10-CM | POA: Diagnosis not present

## 2022-05-25 DIAGNOSIS — J4521 Mild intermittent asthma with (acute) exacerbation: Secondary | ICD-10-CM | POA: Diagnosis not present

## 2022-05-25 DIAGNOSIS — J069 Acute upper respiratory infection, unspecified: Secondary | ICD-10-CM | POA: Diagnosis not present

## 2022-06-03 DIAGNOSIS — Z049 Encounter for examination and observation for unspecified reason: Secondary | ICD-10-CM | POA: Diagnosis not present

## 2022-06-03 DIAGNOSIS — G43719 Chronic migraine without aura, intractable, without status migrainosus: Secondary | ICD-10-CM | POA: Diagnosis not present

## 2022-09-13 DIAGNOSIS — G43911 Migraine, unspecified, intractable, with status migrainosus: Secondary | ICD-10-CM | POA: Diagnosis not present

## 2022-10-06 DIAGNOSIS — J4541 Moderate persistent asthma with (acute) exacerbation: Secondary | ICD-10-CM | POA: Diagnosis not present

## 2022-10-08 DIAGNOSIS — R051 Acute cough: Secondary | ICD-10-CM | POA: Diagnosis not present

## 2022-10-08 DIAGNOSIS — Z20822 Contact with and (suspected) exposure to covid-19: Secondary | ICD-10-CM | POA: Diagnosis not present

## 2022-10-08 DIAGNOSIS — R0602 Shortness of breath: Secondary | ICD-10-CM | POA: Diagnosis not present

## 2022-10-08 DIAGNOSIS — J45901 Unspecified asthma with (acute) exacerbation: Secondary | ICD-10-CM | POA: Diagnosis not present

## 2022-10-08 DIAGNOSIS — R062 Wheezing: Secondary | ICD-10-CM | POA: Diagnosis not present

## 2023-01-23 DIAGNOSIS — R0981 Nasal congestion: Secondary | ICD-10-CM | POA: Diagnosis not present

## 2023-01-23 DIAGNOSIS — H9203 Otalgia, bilateral: Secondary | ICD-10-CM | POA: Diagnosis not present

## 2023-01-23 DIAGNOSIS — J0140 Acute pansinusitis, unspecified: Secondary | ICD-10-CM | POA: Diagnosis not present

## 2023-03-25 DIAGNOSIS — M25532 Pain in left wrist: Secondary | ICD-10-CM | POA: Diagnosis not present

## 2023-03-25 DIAGNOSIS — M542 Cervicalgia: Secondary | ICD-10-CM | POA: Diagnosis not present

## 2023-03-25 DIAGNOSIS — W010XXA Fall on same level from slipping, tripping and stumbling without subsequent striking against object, initial encounter: Secondary | ICD-10-CM | POA: Diagnosis not present

## 2023-03-27 DIAGNOSIS — H9202 Otalgia, left ear: Secondary | ICD-10-CM | POA: Diagnosis not present

## 2023-03-31 DIAGNOSIS — M542 Cervicalgia: Secondary | ICD-10-CM | POA: Diagnosis not present

## 2023-03-31 DIAGNOSIS — M79645 Pain in left finger(s): Secondary | ICD-10-CM | POA: Diagnosis not present

## 2023-04-21 DIAGNOSIS — N915 Oligomenorrhea, unspecified: Secondary | ICD-10-CM | POA: Diagnosis not present

## 2023-04-21 DIAGNOSIS — Z124 Encounter for screening for malignant neoplasm of cervix: Secondary | ICD-10-CM | POA: Diagnosis not present

## 2023-04-21 DIAGNOSIS — Z6841 Body Mass Index (BMI) 40.0 and over, adult: Secondary | ICD-10-CM | POA: Diagnosis not present

## 2023-04-21 DIAGNOSIS — Z3202 Encounter for pregnancy test, result negative: Secondary | ICD-10-CM | POA: Diagnosis not present

## 2023-04-21 DIAGNOSIS — Z01419 Encounter for gynecological examination (general) (routine) without abnormal findings: Secondary | ICD-10-CM | POA: Diagnosis not present

## 2023-04-28 DIAGNOSIS — M542 Cervicalgia: Secondary | ICD-10-CM | POA: Diagnosis not present

## 2023-05-10 DIAGNOSIS — R6882 Decreased libido: Secondary | ICD-10-CM | POA: Diagnosis not present

## 2023-05-10 DIAGNOSIS — N915 Oligomenorrhea, unspecified: Secondary | ICD-10-CM | POA: Diagnosis not present

## 2023-06-06 DIAGNOSIS — G43911 Migraine, unspecified, intractable, with status migrainosus: Secondary | ICD-10-CM | POA: Diagnosis not present

## 2023-08-02 DIAGNOSIS — G43909 Migraine, unspecified, not intractable, without status migrainosus: Secondary | ICD-10-CM | POA: Diagnosis not present

## 2023-08-07 DIAGNOSIS — H9209 Otalgia, unspecified ear: Secondary | ICD-10-CM | POA: Diagnosis not present

## 2023-09-27 DIAGNOSIS — E282 Polycystic ovarian syndrome: Secondary | ICD-10-CM | POA: Diagnosis not present
